# Patient Record
Sex: Male | Born: 1940 | Race: Black or African American | Hispanic: No | Marital: Single | Smoking: Never smoker
Health system: Southern US, Community
[De-identification: ages and names within clinical notes are randomized; demographics above are authoritative.]

## PROBLEM LIST (undated history)

## (undated) DIAGNOSIS — H547 Unspecified visual loss: Secondary | ICD-10-CM

## (undated) DIAGNOSIS — F79 Unspecified intellectual disabilities: Secondary | ICD-10-CM

---

## 2010-06-10 ENCOUNTER — Inpatient Hospital Stay (HOSPITAL_COMMUNITY)
Admission: EM | Admit: 2010-06-10 | Discharge: 2010-06-14 | DRG: 093 | Disposition: A | Discharge status: Home / Self Care | Payer: Medicare Other | Attending: Internal Medicine | Admitting: Internal Medicine

## 2010-06-10 ENCOUNTER — Emergency Department (HOSPITAL_COMMUNITY): Payer: Medicare Other

## 2010-06-10 DIAGNOSIS — R5381 Other malaise: Secondary | ICD-10-CM | POA: Diagnosis present

## 2010-06-10 DIAGNOSIS — R279 Unspecified lack of coordination: Principal | ICD-10-CM | POA: Diagnosis present

## 2010-06-10 DIAGNOSIS — G40909 Epilepsy, unspecified, not intractable, without status epilepticus: Secondary | ICD-10-CM | POA: Diagnosis present

## 2010-06-10 DIAGNOSIS — D509 Iron deficiency anemia, unspecified: Secondary | ICD-10-CM | POA: Diagnosis present

## 2010-06-10 DIAGNOSIS — I10 Essential (primary) hypertension: Secondary | ICD-10-CM | POA: Diagnosis present

## 2010-06-10 DIAGNOSIS — R4182 Altered mental status, unspecified: Secondary | ICD-10-CM | POA: Diagnosis present

## 2010-06-10 DIAGNOSIS — T420X5A Adverse effect of hydantoin derivatives, initial encounter: Secondary | ICD-10-CM | POA: Diagnosis present

## 2010-06-10 DIAGNOSIS — K047 Periapical abscess without sinus: Secondary | ICD-10-CM | POA: Diagnosis present

## 2010-06-10 DIAGNOSIS — E785 Hyperlipidemia, unspecified: Secondary | ICD-10-CM | POA: Diagnosis present

## 2010-06-10 DIAGNOSIS — D696 Thrombocytopenia, unspecified: Secondary | ICD-10-CM | POA: Diagnosis present

## 2010-06-10 DIAGNOSIS — Z8673 Personal history of transient ischemic attack (TIA), and cerebral infarction without residual deficits: Secondary | ICD-10-CM

## 2010-06-10 DIAGNOSIS — Z9181 History of falling: Secondary | ICD-10-CM

## 2010-06-10 LAB — BASIC METABOLIC PANEL
CO2: 29 mEq/L (ref 19–32)
Chloride: 100 mEq/L (ref 96–112)
GFR calc non Af Amer: >60 mL/min (ref >60)
Glucose, Bld: 96 mg/dL (ref 70–99)
Potassium: 3.9 mEq/L (ref 3.5–5.1)
Sodium: 140 mEq/L (ref 135–145)

## 2010-06-10 LAB — CBC
HCT: 42.7 % (ref 39.0–52.0)
Hemoglobin: 13.9 g/dL (ref 13.0–17.0)
MCV: 98.8 fL (ref 78.0–100.0)
RBC: 4.32 MIL/uL (ref 4.22–5.81)
RDW: 13.5 % (ref 11.5–15.5)
WBC: 4.5 10*3/uL (ref 4.0–10.5)

## 2010-06-10 LAB — DIFFERENTIAL
Basophils Absolute: 0 10*3/uL (ref 0.0–0.1)
Lymphocytes Relative: 44 % (ref 12–46)
Lymphs Abs: 2 10*3/uL (ref 0.7–4.0)
Neutro Abs: 2.1 10*3/uL (ref 1.7–7.7)
Neutrophils Relative %: 47 % (ref 43–77)

## 2010-06-11 LAB — URINE MICROSCOPIC-ADD ON

## 2010-06-11 LAB — URINALYSIS, ROUTINE W REFLEX MICROSCOPIC
Glucose, UA: NEGATIVE mg/dL
Leukocytes, UA: NEGATIVE
Protein, ur: NEGATIVE mg/dL
Specific Gravity, Urine: 1.02 (ref 1.005–1.030)
Urobilinogen, UA: 1 mg/dL (ref 0.0–1.0)

## 2010-06-11 LAB — CBC
HCT: 37.6 % — ABNORMAL LOW (ref 39.0–52.0)
MCV: 98.9 fL (ref 78.0–100.0)
RBC: 3.8 MIL/uL — ABNORMAL LOW (ref 4.22–5.81)
WBC: 4.1 10*3/uL (ref 4.0–10.5)

## 2010-06-11 LAB — CARDIAC PANEL(CRET KIN+CKTOT+MB+TROPI)
Relative Index: INVALID (ref 0.0–2.5)
Relative Index: 1.5 (ref 0.0–2.5)
Troponin I: 0.03 ng/mL (ref 0.00–0.06)
Troponin I: 0.03 ng/mL (ref 0.00–0.06)

## 2010-06-11 LAB — COMPREHENSIVE METABOLIC PANEL
Alkaline Phosphatase: 89 U/L (ref 39–117)
BUN: 10 mg/dL (ref 6–23)
Chloride: 104 mEq/L (ref 96–112)
GFR calc non Af Amer: >60 mL/min (ref >60)
Glucose, Bld: 69 mg/dL — ABNORMAL LOW (ref 70–99)
Potassium: 3.8 mEq/L (ref 3.5–5.1)
Total Bilirubin: 0.8 mg/dL (ref 0.3–1.2)

## 2010-06-12 LAB — CBC
HCT: 36.3 % — ABNORMAL LOW (ref 39.0–52.0)
Hemoglobin: 11.9 g/dL — ABNORMAL LOW (ref 13.0–17.0)
RDW: 13.4 % (ref 11.5–15.5)
WBC: 4.9 10*3/uL (ref 4.0–10.5)

## 2010-06-12 LAB — BASIC METABOLIC PANEL
CO2: 28 mEq/L (ref 19–32)
GFR calc non Af Amer: >60 mL/min (ref >60)
Glucose, Bld: 101 mg/dL — ABNORMAL HIGH (ref 70–99)
Potassium: 3.5 mEq/L (ref 3.5–5.1)
Sodium: 139 mEq/L (ref 135–145)

## 2010-06-12 LAB — LIPID PANEL
HDL: 41 mg/dL (ref >39)
Triglycerides: 79 mg/dL (ref <150)

## 2010-06-13 LAB — CBC
MCH: 31.6 pg (ref 26.0–34.0)
MCV: 97 fL (ref 78.0–100.0)
Platelets: 135 10*3/uL — ABNORMAL LOW (ref 150–400)
RDW: 13.4 % (ref 11.5–15.5)
WBC: 4 10*3/uL (ref 4.0–10.5)

## 2010-06-13 LAB — IRON AND TIBC: UIBC: 85 ug/dL

## 2010-06-13 LAB — PHENYTOIN LEVEL, TOTAL: Phenytoin Lvl: 21.5 ug/mL — ABNORMAL HIGH (ref 10.0–20.0)

## 2010-06-13 LAB — BASIC METABOLIC PANEL
BUN: 8 mg/dL (ref 6–23)
Calcium: 8.3 mg/dL — ABNORMAL LOW (ref 8.4–10.5)
Creatinine, Ser: 0.93 mg/dL (ref 0.4–1.5)
GFR calc non Af Amer: >60 mL/min (ref >60)
Potassium: 3.3 mEq/L — ABNORMAL LOW (ref 3.5–5.1)

## 2010-06-13 LAB — VITAMIN B12: Vitamin B-12: 494 pg/mL (ref 211–911)

## 2010-06-13 LAB — FOLATE: Folate: 3.6 ng/mL

## 2010-06-14 LAB — PHENYTOIN LEVEL, TOTAL: Phenytoin Lvl: 16.7 ug/mL (ref 10.0–20.0)

## 2010-06-15 NOTE — Discharge Summary (Signed)
[name], [name]                  ACCOUNT NO.:  [id number]  MEDICAL RECORD NO.:  [id number]           PATIENT TYPE:  I  LOCATION:  1517                         FACILITY:  [facility]  PHYSICIAN:  Hillery Aldo, M.D.   DATE OF BIRTH:  Nov 21, 1940  DATE OF ADMISSION:  06/10/2010 DATE OF DISCHARGE:  06/14/2010                              DISCHARGE SUMMARY   PRIMARY CARE PHYSICIAN:  Dr. Knox Royalty.  DISCHARGE DIAGNOSES: 1. Ataxia and altered mental status secondary to Dilantin toxicity. 2. 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 3. Periapical abscess. 4. History of seizure disorder. 5. Hypertension. 6. Dyslipidemia. 7. Mild normocytic anemia. 8. Mild thrombocytopenia.  DISCHARGE MEDICATIONS: 1. Amlodipine 10 mg p.o. daily. 2. Augmentin 875 mg p.o. b.i.d. x 3 more days. 3. Aspirin 81 mg p.o. daily. 4. Zocor 20 mg p.o. daily. 5. Dilantin decreased to 100 mg p.o. b.i.d. 6. Tegretol 400 mg p.o. q.h.s. 7. Folic acid 1 mg p.o. daily.  CONSULTATIONS:  None.  BRIEF ADMISSION HPI:  The patient is a 70 year old male with past medical history of seizure disorder, who presented to the hospital with generalized weakness and ataxia.  The patient had some falls at home due to his ataxia.  The patient subsequently was brought into the hospital by family and was found to be Dilantin toxic with a Dilantin level of 35.7.  He subsequently was referred to the hospitalist's service for inpatient evaluation and treatment.  For the full details, please see the dictated report done by Dr. Pearson Grippe.  PROCEDURES AND DIAGNOSTIC STUDIES: 1. CT scan of the head on June 10, 2010 showed no acute intracranial     abnormality.  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 2. Maxillofacial CT without contrast on June 10, 2010 showed no     fractures.  Extensive periapical lucency around the roots of tooth     #19.  This is consistent with periapical  abscess. 3. Chest x-ray on June 10, 2010 was normal.  DISCHARGE LABORATORY VALUES:  Dilantin level was 16.7.  Iron was 81, total iron binding capacity 166 with 49% saturation.  Urine iron-binding capacity was 85.  B12 was 494, serum folate low at 3.6.  Ferritin was 114.  Urine cultures were negative.  Lipids showed a cholesterol of 196, triglycerides 79, HDL 41, and LDL of 139.  Cardiac markers were negative x 3 sets.  Initial Tegretol level was low at less than 2.  HOSPITAL COURSE BY PROBLEM: 1. Ataxia and altered mental status secondary to Dilantin toxicity:     The patient was admitted and his Dilantin level was checked     serially.  His Dilantin was held throughout the course of his     hospital stay and will be resumed at a lower dose on June 15, 2010.  The patient has not had a seizure in an extended period of     time and at this point, would recommend not increasing his Dilantin     even if his levels are subtherapeutic if he  is not having problems     with breakthrough seizures. 2. History of cerebrovascular disease/old CVA:  The patient was put on     low-dose aspirin therapy for secondary prevention.  Attention was     also made towards better blood pressure and lipid control with the     goal blood pressure of less than 130/85 and a target LDL of less     than 100. 3. Periapical abscess:  The patient has been on Augmentin and will     complete his course in 3 more days.  Would recommend dental     followup to see if he has any symptoms referable to toothache or     headaches for further treatment. 4. Seizure disorder:  Again, the patient has not had a seizure for a     prolonged period of time.  His Dilantin level was high and his     Tegretol level was low while in the hospital.  He was put on a     higher dose of Tegretol while in the hospital but will be     discharged on his home regimen with further outpatient followup.     Again, would not target a  therapeutic range if he is not having     breakthrough seizures and would maintain him on his usual     outpatient dose of Tegretol and a lower dose of Dilantin. 5. Hypertension:  The patient did develop fairly elevated blood     pressure readings while in the hospital.  He was subsequently put     on low-dose Norvasc and this was titrated up to achieve good blood     pressure control.  His discharge blood pressure is 125/85.  The     patient's nephew states that he has not ever been diagnosed with     hypertension before and is reasonable to consider discontinuing     this medication with close followup if he proves to have good blood     pressure control in outpatient setting. 6. Dyslipidemia:  Given the patient has evidence of old an old stroke     on CT scanning, recommend a target LDL of less than 100.  He was     placed on Zocor 20 mg p.o. daily and will need a followup fasting     lipid panel and liver function studies done in approximately 6     weeks. 7. Mild anemia/thrombocytopenia:  May be due to folic acid deficiency.     The patient will be discharged home on folic acid supplementation     therapy.  DISPOSITION:  The patient is medically stable and will be discharged home.  CONDITION ON DISCHARGE:  Improved.  Time spent coordinating care for discharge and discharge instruction including face-to-face time equals 35 minutes.  DISCHARGE DIET:  Heart healthy, no added salt.     Hillery Aldo, M.D.    CR/MEDQ  D:  06/14/2010  T:  06/14/2010  Job:  [id number]  cc:   Dr. Knox Royalty  Electronically Signed by Hillery Aldo M.D. on 06/15/2010 04:54:09 PM

## 2010-07-08 NOTE — H&P (Signed)
[name], [name]                  ACCOUNT NO.:  [id number]  MEDICAL RECORD NO.:  [id number]           PATIENT TYPE:  E  LOCATION:  WLED                         FACILITY:  [facility]  PHYSICIAN:  Massie Maroon, MD        DATE OF BIRTH:  02-17-1941  DATE OF ADMISSION:  06/10/2010 DATE OF DISCHARGE:                             HISTORY & PHYSICAL   CHIEF COMPLAINT:  Ataxia.  HISTORY OF PRESENT ILLNESS:  This is a 70 year old male with a history of seizure disorder, apparently has had difficulty with walking.  He has complained of weakness as well.  The patient apparently last week tried to get up and then fell face on the floor.  He was complaining of some headache yesterday and he took some ibuprofen and then felt a little bit better, but then also complained about his head hurting again today, so his nephew brought him to the ED for evaluation.  His balance has been poor in that appears to be why he has been falling.  His nephew notes that his Dilantin was increased back in October and today when his dilantin level was checked, it was found to be 35.7.  His tegretol level was low at less than 2.0.  The patient had a chest x-ray, which was negative.  CT scan of the maxillofacial area showed periapical abscess of tooth #19.  CT brain was negative for any acute abnormality, but showed old caudate lacunar infarctions and atrophy.  The patient will be admitted for ataxia and falls secondary to supratherapeutic dilantin level.  PAST MEDICAL HISTORY: 1. Seizure disorder. 2. CVA as per CT scan on June 10, 2010.  PAST SURGICAL HISTORY: 1. Hernia repair. 2. Cataracts.  SOCIAL HISTORY:  The patient does not smoke or drink.  He lives with his nephew who can be reached at [phone].  FAMILY HISTORY:  Positive for diabetes and hypertension.  ALLERGIES:  No known drug allergies.  MEDICATIONS: 1. Tegretol 600 mg p.o. q.h.s. 2. Dilantin Infatabs 200 mg p.o. q.h.s. and 100 mg p.o.  q.a.m.  REVIEW OF SYSTEMS:  Negative for all 10 organ systems except for pertinent positives stated above.  PHYSICAL EXAMINATION:  VITAL SIGNS:  Temperature 98.6, pulse 88, blood pressure 122/90, pulse oximetry is 97% on room air. HEENT:  Anicteric, EOMI, no nystagmus.  Pupils 1.5 mm, symmetric. Direct consensual near reflexes intact.  Positive arcus senilis.  NECK: No JVD, no bruit. HEART:  Regular rate and rhythm.  S1 and S2.  No murmurs, gallops or rubs. LUNGS:  Clear to auscultation bilaterally. ABDOMEN:  Soft, nontender, nondistended.  Positive bowel sounds. EXTREMITIES:  No cyanosis, clubbing or edema. SKIN:  No rashes. LYMPH NODES:  No adenopathy. NEUROLOGIC:  Nonfocal.  No pronator drift.  Reflexes are 2+, symmetric, diffuse with downgoing toes bilaterally.  Motor strength 5/5 in all 4 extremities.  Pinprick intact.  LABORATORY DATA:  Dilantin 35.7.  Tegretol less than 2.0.  Sodium 140, potassium 3.9, BUN 10, creatinine 1.08.  WBC 4.5, hemoglobin 13.9, platelet count 183.  Chest x-ray, normal chest.  CT maxillofacial shows a periapical  abscess, tooth #19.  CT brain shows old lacunar caudate infarcts as above.  EKG shows normal sinus rhythm at 75, left axis deviation, early R-wave progression, Q-waves in V4, V5, and V6.  No prior EKG for comparison.  ASSESSMENT/PLAN: 1. Ataxia, falls:  This is likely secondary to supratherapeutic     dilantin level.  We will hold Dilantin and recheck a level in the     a.m. 2. Old cerebrovascular accident:  Check a lipid panel.  The patient     will be started on aspirin 81 mg p.o. daily.  We will also start     him on Zocor 40 mg p.o. q.h.s. 3. Periapical abscess:  Augmentin 875 mg p.o. b.i.d. 4. Deep venous thrombosis prophylaxis:  SCDs.     Massie Maroon, MD     JYK/MEDQ  D:  06/10/2010  T:  06/11/2010  Job:  [id number]  cc:   Knox Royalty, MD  Electronically Signed by Pearson Grippe MD on 07/08/2010 01:43:41 AM

## 2018-05-04 ENCOUNTER — Inpatient Hospital Stay (HOSPITAL_COMMUNITY)
Admission: EM | Admit: 2018-05-04 | Discharge: 2018-05-09 | DRG: 871 | Disposition: A | Discharge status: Home Health | Payer: Medicare (Managed Care) | Attending: Internal Medicine | Admitting: Internal Medicine

## 2018-05-04 ENCOUNTER — Emergency Department (HOSPITAL_COMMUNITY): Payer: Medicare (Managed Care)

## 2018-05-04 ENCOUNTER — Other Ambulatory Visit: Payer: Self-pay

## 2018-05-04 ENCOUNTER — Encounter (HOSPITAL_COMMUNITY): Payer: Self-pay

## 2018-05-04 DIAGNOSIS — D696 Thrombocytopenia, unspecified: Secondary | ICD-10-CM | POA: Diagnosis not present

## 2018-05-04 DIAGNOSIS — N170 Acute kidney failure with tubular necrosis: Secondary | ICD-10-CM | POA: Diagnosis present

## 2018-05-04 DIAGNOSIS — N10 Acute tubulo-interstitial nephritis: Secondary | ICD-10-CM | POA: Diagnosis not present

## 2018-05-04 DIAGNOSIS — E876 Hypokalemia: Secondary | ICD-10-CM | POA: Diagnosis present

## 2018-05-04 DIAGNOSIS — G40909 Epilepsy, unspecified, not intractable, without status epilepticus: Secondary | ICD-10-CM | POA: Diagnosis present

## 2018-05-04 DIAGNOSIS — J189 Pneumonia, unspecified organism: Secondary | ICD-10-CM

## 2018-05-04 DIAGNOSIS — R7881 Bacteremia: Secondary | ICD-10-CM | POA: Diagnosis not present

## 2018-05-04 DIAGNOSIS — N368 Other specified disorders of urethra: Secondary | ICD-10-CM | POA: Diagnosis not present

## 2018-05-04 DIAGNOSIS — E872 Acidosis: Secondary | ICD-10-CM | POA: Diagnosis present

## 2018-05-04 DIAGNOSIS — R6250 Unspecified lack of expected normal physiological development in childhood: Secondary | ICD-10-CM | POA: Diagnosis not present

## 2018-05-04 DIAGNOSIS — R509 Fever, unspecified: Secondary | ICD-10-CM | POA: Diagnosis not present

## 2018-05-04 DIAGNOSIS — A419 Sepsis, unspecified organism: Secondary | ICD-10-CM

## 2018-05-04 DIAGNOSIS — A4151 Sepsis due to Escherichia coli [E. coli]: Secondary | ICD-10-CM | POA: Diagnosis present

## 2018-05-04 DIAGNOSIS — R0902 Hypoxemia: Secondary | ICD-10-CM | POA: Diagnosis present

## 2018-05-04 DIAGNOSIS — A4150 Gram-negative sepsis, unspecified: Secondary | ICD-10-CM | POA: Diagnosis present

## 2018-05-04 DIAGNOSIS — J690 Pneumonitis due to inhalation of food and vomit: Secondary | ICD-10-CM | POA: Diagnosis present

## 2018-05-04 DIAGNOSIS — N136 Pyonephrosis: Secondary | ICD-10-CM | POA: Diagnosis present

## 2018-05-04 DIAGNOSIS — N401 Enlarged prostate with lower urinary tract symptoms: Secondary | ICD-10-CM | POA: Diagnosis present

## 2018-05-04 DIAGNOSIS — F79 Unspecified intellectual disabilities: Secondary | ICD-10-CM | POA: Diagnosis present

## 2018-05-04 DIAGNOSIS — N320 Bladder-neck obstruction: Secondary | ICD-10-CM | POA: Diagnosis present

## 2018-05-04 DIAGNOSIS — A408 Other streptococcal sepsis: Secondary | ICD-10-CM | POA: Diagnosis present

## 2018-05-04 DIAGNOSIS — R339 Retention of urine, unspecified: Secondary | ICD-10-CM | POA: Diagnosis present

## 2018-05-04 DIAGNOSIS — D72819 Decreased white blood cell count, unspecified: Secondary | ICD-10-CM | POA: Diagnosis present

## 2018-05-04 DIAGNOSIS — N3001 Acute cystitis with hematuria: Secondary | ICD-10-CM

## 2018-05-04 DIAGNOSIS — N390 Urinary tract infection, site not specified: Secondary | ICD-10-CM | POA: Diagnosis not present

## 2018-05-04 DIAGNOSIS — Z66 Do not resuscitate: Secondary | ICD-10-CM | POA: Diagnosis present

## 2018-05-04 DIAGNOSIS — R0602 Shortness of breath: Secondary | ICD-10-CM

## 2018-05-04 DIAGNOSIS — Z79899 Other long term (current) drug therapy: Secondary | ICD-10-CM | POA: Diagnosis not present

## 2018-05-04 DIAGNOSIS — N1330 Unspecified hydronephrosis: Secondary | ICD-10-CM | POA: Diagnosis present

## 2018-05-04 DIAGNOSIS — D72829 Elevated white blood cell count, unspecified: Secondary | ICD-10-CM

## 2018-05-04 HISTORY — DX: Unspecified intellectual disabilities: F79

## 2018-05-04 HISTORY — DX: Unspecified visual loss: H54.7

## 2018-05-04 LAB — URINALYSIS, ROUTINE W REFLEX MICROSCOPIC
Bilirubin Urine: NEGATIVE
GLUCOSE, UA: NEGATIVE mg/dL
KETONES UR: NEGATIVE mg/dL
NITRITE: NEGATIVE
PH: 5 (ref 5.0–8.0)
Protein, ur: NEGATIVE mg/dL
RBC / HPF: >50 RBC/hpf — ABNORMAL HIGH (ref 0–5)
Specific Gravity, Urine: 1.006 (ref 1.005–1.030)

## 2018-05-04 LAB — COMPREHENSIVE METABOLIC PANEL
ALT: 16 U/L (ref 0–44)
ANION GAP: 19 — AB (ref 5–15)
AST: 36 U/L (ref 15–41)
Albumin: 3.5 g/dL (ref 3.5–5.0)
Alkaline Phosphatase: 88 U/L (ref 38–126)
BUN: 18 mg/dL (ref 8–23)
CO2: 20 mmol/L — AB (ref 22–32)
Calcium: 9.4 mg/dL (ref 8.9–10.3)
Chloride: 105 mmol/L (ref 98–111)
Creatinine, Ser: 1.97 mg/dL — ABNORMAL HIGH (ref 0.61–1.24)
GFR calc Af Amer: 37 mL/min — ABNORMAL LOW (ref >60)
GFR calc non Af Amer: 32 mL/min — ABNORMAL LOW (ref >60)
GLUCOSE: 95 mg/dL (ref 70–99)
POTASSIUM: 3.4 mmol/L — AB (ref 3.5–5.1)
SODIUM: 144 mmol/L (ref 135–145)
Total Bilirubin: 1.9 mg/dL — ABNORMAL HIGH (ref 0.3–1.2)
Total Protein: 6.7 g/dL (ref 6.5–8.1)

## 2018-05-04 LAB — CBC WITH DIFFERENTIAL/PLATELET
ABS IMMATURE GRANULOCYTES: 0.01 10*3/uL (ref 0.00–0.07)
Basophils Absolute: 0 10*3/uL (ref 0.0–0.1)
Basophils Relative: 1 %
EOS ABS: 0 10*3/uL (ref 0.0–0.5)
Eosinophils Relative: 0 %
HCT: 44.6 % (ref 39.0–52.0)
HEMOGLOBIN: 14.3 g/dL (ref 13.0–17.0)
Immature Granulocytes: 1 %
LYMPHS ABS: 0.3 10*3/uL — AB (ref 0.7–4.0)
LYMPHS PCT: 18 %
MCH: 32.1 pg (ref 26.0–34.0)
MCHC: 32.1 g/dL (ref 30.0–36.0)
MCV: 100 fL (ref 80.0–100.0)
Monocytes Absolute: 0 10*3/uL — ABNORMAL LOW (ref 0.1–1.0)
Monocytes Relative: 1 %
NEUTROS PCT: 79 %
Neutro Abs: 1.4 10*3/uL — ABNORMAL LOW (ref 1.7–7.7)
PLATELETS: 95 10*3/uL — AB (ref 150–400)
RBC: 4.46 MIL/uL (ref 4.22–5.81)
RDW: 12.9 % (ref 11.5–15.5)
WBC: 1.7 10*3/uL — ABNORMAL LOW (ref 4.0–10.5)
nRBC: 0 % (ref 0.0–0.2)

## 2018-05-04 LAB — HEMOGLOBIN A1C
Hgb A1c MFr Bld: 5.8 % — ABNORMAL HIGH (ref 4.8–5.6)
Mean Plasma Glucose: 119.76 mg/dL

## 2018-05-04 LAB — LACTIC ACID, PLASMA
Lactic Acid, Venous: 7.7 mmol/L (ref 0.5–1.9)
Lactic Acid, Venous: 6.3 mmol/L (ref 0.5–1.9)
Lactic Acid, Venous: 4.2 mmol/L (ref 0.5–1.9)

## 2018-05-04 LAB — TSH: TSH: 0.713 u[IU]/mL (ref 0.350–4.500)

## 2018-05-04 LAB — LIPASE, BLOOD: Lipase: 23 U/L (ref 11–51)

## 2018-05-04 LAB — INFLUENZA PANEL BY PCR (TYPE A & B)
Influenza A By PCR: NEGATIVE
Influenza B By PCR: NEGATIVE

## 2018-05-04 MED ORDER — SODIUM CHLORIDE 0.9 % IV SOLN: 1.0000 g | INTRAVENOUS | Status: DC

## 2018-05-04 MED ORDER — IBUPROFEN 600 MG PO TABS: 600.0000 mg | ORAL_TABLET | Freq: Four times a day (QID) | ORAL | Status: DC | PRN

## 2018-05-04 MED ORDER — ACETAMINOPHEN 325 MG PO TABS: 650.0000 mg | ORAL_TABLET | Freq: Four times a day (QID) | ORAL | Status: DC | PRN

## 2018-05-04 MED ORDER — VANCOMYCIN HCL 500 MG IV SOLR: 500.0000 mg | INTRAVENOUS | Status: DC

## 2018-05-04 MED ORDER — SODIUM CHLORIDE 0.9 % IV BOLUS
500.0000 mL | Freq: Once | INTRAVENOUS | Status: AC
  Administered 2018-05-04: 500 mL via INTRAVENOUS

## 2018-05-04 MED ORDER — LACTATED RINGERS IV SOLN
INTRAVENOUS | Status: DC
  Administered 2018-05-04: 13:00:00 via INTRAVENOUS

## 2018-05-04 MED ORDER — ACETAMINOPHEN 650 MG RE SUPP: 650.0000 mg | Freq: Four times a day (QID) | RECTAL | Status: DC | PRN

## 2018-05-04 MED ORDER — LATANOPROST 0.005 % OP SOLN
1.0000 [drp] | Freq: Every day | OPHTHALMIC | Status: DC
  Administered 2018-05-04 – 2018-05-08 (×4): 1 [drp] via OPHTHALMIC
  Filled 2018-05-04: qty 2.5

## 2018-05-04 MED ORDER — VANCOMYCIN HCL IN DEXTROSE 1-5 GM/200ML-% IV SOLN
1000.0000 mg | Freq: Once | INTRAVENOUS | Status: AC
  Administered 2018-05-04: 1000 mg via INTRAVENOUS
  Filled 2018-05-04: qty 200

## 2018-05-04 MED ORDER — POLYETHYLENE GLYCOL 3350 17 G PO PACK: 17.0000 g | PACK | Freq: Every day | ORAL | Status: DC | PRN

## 2018-05-04 MED ORDER — METRONIDAZOLE IN NACL 5-0.79 MG/ML-% IV SOLN
500.0000 mg | Freq: Three times a day (TID) | INTRAVENOUS | Status: DC
  Administered 2018-05-04: 500 mg via INTRAVENOUS
  Filled 2018-05-04: qty 100

## 2018-05-04 MED ORDER — SODIUM CHLORIDE 0.9 % IV BOLUS (SEPSIS)
1000.0000 mL | Freq: Once | INTRAVENOUS | Status: AC
  Administered 2018-05-04: 1000 mL via INTRAVENOUS

## 2018-05-04 MED ORDER — ENSURE ENLIVE PO LIQD
237.0000 mL | Freq: Three times a day (TID) | ORAL | Status: DC
  Administered 2018-05-04 – 2018-05-09 (×7): 237 mL via ORAL
  Filled 2018-05-04: qty 237

## 2018-05-04 MED ORDER — POTASSIUM CHLORIDE IN NACL 20-0.9 MEQ/L-% IV SOLN
INTRAVENOUS | Status: DC
  Administered 2018-05-04 – 2018-05-05 (×2): via INTRAVENOUS
  Filled 2018-05-04 (×3): qty 1000

## 2018-05-04 MED ORDER — SODIUM CHLORIDE 0.9 % IV SOLN
2.0000 g | Freq: Once | INTRAVENOUS | Status: AC
  Administered 2018-05-04: 2 g via INTRAVENOUS
  Filled 2018-05-04: qty 2

## 2018-05-04 MED ORDER — KETOROLAC TROMETHAMINE 15 MG/ML IJ SOLN: 15.0000 mg | Freq: Four times a day (QID) | INTRAMUSCULAR | Status: DC | PRN

## 2018-05-04 MED ORDER — DORZOLAMIDE HCL 2 % OP SOLN
1.0000 [drp] | Freq: Every day | OPHTHALMIC | Status: DC
  Administered 2018-05-04 – 2018-05-09 (×6): 1 [drp] via OPHTHALMIC
  Filled 2018-05-04: qty 10

## 2018-05-04 MED ORDER — IOHEXOL 300 MG/ML  SOLN
75.0000 mL | Freq: Once | INTRAMUSCULAR | Status: AC | PRN
  Administered 2018-05-04: 75 mL via INTRAVENOUS

## 2018-05-04 MED ORDER — ACETAMINOPHEN 650 MG RE SUPP
650.0000 mg | Freq: Once | RECTAL | Status: AC
  Administered 2018-05-04: 650 mg via RECTAL
  Filled 2018-05-04: qty 1

## 2018-05-04 MED ORDER — HEPARIN SODIUM (PORCINE) 5000 UNIT/ML IJ SOLN
5000.0000 [IU] | Freq: Three times a day (TID) | INTRAMUSCULAR | Status: DC
  Administered 2018-05-04 – 2018-05-08 (×13): 5000 [IU] via SUBCUTANEOUS
  Filled 2018-05-04 (×13): qty 1

## 2018-05-04 MED ORDER — LEVETIRACETAM 250 MG PO TABS
250.0000 mg | ORAL_TABLET | Freq: Two times a day (BID) | ORAL | Status: DC
  Administered 2018-05-04: 250 mg via ORAL
  Filled 2018-05-04 (×2): qty 1

## 2018-05-04 MED ORDER — SODIUM CHLORIDE 0.9 % IV BOLUS (SEPSIS)
250.0000 mL | Freq: Once | INTRAVENOUS | Status: AC
  Administered 2018-05-04: 250 mL via INTRAVENOUS

## 2018-05-04 NOTE — ED Notes (Signed)
Attempted to call report x 1  

## 2018-05-04 NOTE — H&P (Signed)
History and Physical  Unassigned medical admission    Hai Tasker OBS:[id number] DOB: [birth year] DOA: 05/04/2018  PCP: Kristie Cowman, MD  Patient coming from: Home  I have personally briefly reviewed patient's old medical records in [place]  Chief Complaint: Not behaving his usual self possible seizure at home  HPI: [name] is a 78 y.o. 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  He was up this morning and was given breakfast.  He then went back to his room and was checked on by his family member and he was noted to be moving what look like a seizure-like activity.  Patient does have a seizure disorder and takes Keppra but he has not had a seizure since he moved to [place] 3 years ago.  He felt hot to the touch and concern was raised for a fever.  EMS was activated and transported the patient into the emergency department.  Patient would not allow for blood pressure check because he was flexing his arms and resisting.  Patient had 2 episodes of vomiting in the emergency department.  He is now speaking a little bit saying "get me covered up" and "God help me".    ED Course: In the emergency department he was found to be extremely ill with creatinine of 1.97, potassium 3.4, a lactic acid of 6.3, a white blood cell count of 1.7, a platelet count of 95, CT scan of the head showed atrophy able infarcts.  CT scan of the abdomen and pelvis showed lateral hydronephrosis with distention of the bladder and dilatation of the penile urethra.  There is suspected a distal penile mass or a distal urethral stenosis.  An irregularly and enlarged median lobe of the prostate gland nonspecific hepatic periportal edema. Urinalysis was abnormal patient referred to internal medicine for further evaluation and management.  Review of Systems: As per HPI otherwise all other  systems reviewed and  negative.    Past Medical History:  Diagnosis Date  . Mentally disabled     History reviewed. No pertinent surgical history.  Social History   Social History Narrative   Mental retardation with developmental delay followed at [facility]     reports that he has never smoked. He has never used smokeless tobacco. No history on file for alcohol and drug.  No Known Allergies  Family History  Problem Relation Age of Onset  . Prostate cancer Brother      Prior to Admission medications   Medication Sig Start Date End Date Taking? Authorizing Provider  bimatoprost (LUMIGAN) 0.01 % SOLN Place 1 drop into both eyes at bedtime.   Yes [provider]  dorzolamide (TRUSOPT) 2 % ophthalmic solution Place 1 drop into both eyes daily.   Yes [provider]  ENSURE PLUS (ENSURE PLUS) LIQD Take 237 mLs by mouth 3 (three) times daily between meals.   Yes [provider]  levETIRAcetam (KEPPRA) 250 MG tablet Take 250 mg by mouth 2 (two) times daily.   Yes [provider]    Physical Exam:  Constitutional: NAD, chronically ill confused Vitals:   05/04/18 1330 05/04/18 1400 05/04/18 1415 05/04/18 1445  BP: (!) 146/106 (!) 122/102 (!) 117/94 (!) 116/95  Pulse:  (!) 120 (!) 117 (!) 116  Resp: 18 (!) '23 19 14  ' Temp:      TempSrc:  SpO2:  98% 97% 98%  Weight:      Height:       Eyes: PERRL, lids and conjunctivae normal ENMT: Mucous membranes are moist. Posterior pharynx clear of any exudate or lesions.poor dentition.  Neck: normal, supple, no masses, no thyromegaly Respiratory: clear to auscultation bilaterally, no wheezing, no crackles. Normal respiratory effort. No accessory muscle use.  Cardiovascular: Regular rate and rhythm, no murmurs / rubs / gallops. No extremity edema. 2+ pedal pulses. No carotid bruits.  Abdomen: no tenderness, no masses palpated. No hepatosplenomegaly. Bowel sounds positive.  Musculoskeletal:  no clubbing / cyanosis. No joint deformity upper and lower extremities. Good ROM, no contractures. Normal muscle tone.  Skin: no rashes, lesions, ulcers. No induration Neurologic: Sensation intact moves all extremities does not follow commands.      Labs on Admission: I have personally reviewed following labs and imaging studies  CBC: Recent Labs  Lab 05/04/18 0815  WBC 1.7*  NEUTROABS 1.4*  HGB 14.3  HCT 44.6  MCV 100.0  PLT 95*   Basic Metabolic Panel: Recent Labs  Lab 05/04/18 0815  NA 144  K 3.4*  CL 105  CO2 20*  GLUCOSE 95  BUN 18  CREATININE 1.97*  CALCIUM 9.4   GFR: Estimated Creatinine Clearance: 19.8 mL/min (A) (by C-G formula based on SCr of 1.97 mg/dL (H)). Liver Function Tests: Recent Labs  Lab 05/04/18 0815  AST 36  ALT 16  ALKPHOS 88  BILITOT 1.9*  PROT 6.7  ALBUMIN 3.5   Recent Labs  Lab 05/04/18 0815  LIPASE 23   Urine analysis:    Component Value Date/Time   COLORURINE YELLOW 05/04/2018 1249   APPEARANCEUR HAZY (A) 05/04/2018 1249   LABSPEC 1.006 05/04/2018 1249   PHURINE 5.0 05/04/2018 1249   GLUCOSEU NEGATIVE 05/04/2018 1249   HGBUR LARGE (A) 05/04/2018 1249   BILIRUBINUR NEGATIVE 05/04/2018 1249   KETONESUR NEGATIVE 05/04/2018 1249   PROTEINUR NEGATIVE 05/04/2018 1249   UROBILINOGEN 1.0 06/11/2010 0005   NITRITE NEGATIVE 05/04/2018 1249   LEUKOCYTESUR MODERATE (A) 05/04/2018 1249    Radiological Exams on Admission: Ct Head Wo Contrast  Result Date: 05/04/2018 CLINICAL DATA:  Seizure with nausea and vomiting. Chronic altered mental status EXAM: CT HEAD WITHOUT CONTRAST TECHNIQUE: Contiguous axial images were obtained from the base of the skull through the vertex without intravenous contrast. COMPARISON:  June 10, 2010 FINDINGS: Brain: There is stable mild diffuse atrophy. There is relative enlargement of the atrium of the right lateral ventricle compared to its counterpart on the left, felt to be secondary to ex vacuo  phenomenon. There is no appreciable intracranial mass, hemorrhage, extra-axial fluid collection, or midline shift. 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 There is evidence of a prior infarct in the head of the caudate nucleus on the right. There is evidence of a prior infarct involving a portion of the body of the caudate nucleus on the left. There is evidence of a prior infarct in the left frontal lobe adjacent to the anterior horn of the left lateral ventricle. No acute appearing infarct is appreciable. Vascular: There is no appreciable hyperdense vessel. There is calcification in each carotid siphon region. Skull: The bony calvarium appears intact. Sinuses/Orbits: There is mucosal thickening in several ethmoid air cells. Other visualized paranasal sinuses are clear. Orbits appear symmetric bilaterally except for evident cataract removal on the right. Other: Mastoid air cells are clear. IMPRESSION: Atrophy with stable prior infarcts. No acute  infarct evident. No mass or hemorrhage. Foci of arterial vascular calcification noted. There is mucosal thickening in several ethmoid air cells. Electronically Signed   By: Lowella Grip III M.D.   On: 05/04/2018 12:28   Ct Abdomen Pelvis W Contrast  Result Date: 05/04/2018 CLINICAL DATA:  Nausea and vomiting. EXAM: CT ABDOMEN AND PELVIS WITH CONTRAST TECHNIQUE: Multidetector CT imaging of the abdomen and pelvis was performed using the standard protocol following bolus administration of intravenous contrast. CONTRAST:  74m OMNIPAQUE IOHEXOL 300 MG/ML  SOLN COMPARISON:  None. FINDINGS: Lower chest: No acute abnormality. Hepatobiliary: There are 2 small cysts in the dome of the right lobe of the liver. Slight periportal edema, nonspecific. No focal liver lesions. Biliary tree is normal. Pancreas: Normal. Spleen: Normal. Adrenals/Urinary Tract: There is bilateral moderate hydronephrosis. The ureters are dilated to the level of  the distended bladder. Bladder wall is slightly thickened. There is slight irregular and nodularity of the median lobe of the prostate gland. The patient has dilatation of the penile urethra. No visible catheter in place. Possibility of a urethral stenosis or a distal penile mass should be considered. Stomach/Bowel: No dilated loops of large or small bowel. Detail is suboptimal due to lack of contrast and lack of body fat. The appendix is visible and appears normal. Vascular/Lymphatic: Slight aortic atherosclerosis. No discrete adenopathy. Reproductive: Enlargement and irregularity and nodularity of the median lobe of the prostate gland. Seminal vesicles are normal. Other: No free air or free fluid in the abdomen. No discrete hernias. Musculoskeletal: No acute bone abnormality. IMPRESSION: 1. Bilateral hydronephrosis with distention of the bladder and dilatation of the penile urethra. I suspect there is either a distal penile mass or distal urethral stenosis. 2.  Aortic Atherosclerosis (ICD10-I70.0). 3. Irregular nodularity and enlargement of the median lobe of the prostate gland. 4. Nonspecific hepatic periportal edema. Electronically Signed   By: JLorriane ShireM.D.   On: 05/04/2018 12:54   Dg Chest Port 1 View  Result Date: 05/04/2018 CLINICAL DATA:  Fever and vomiting EXAM: PORTABLE CHEST 1 VIEW COMPARISON:  06/10/2010 FINDINGS: Cardiac shadows within normal limits. Mild aortic calcifications are seen. The lungs are well aerated bilaterally. No focal infiltrate or sizable effusion is seen. No acute bony abnormality is noted. IMPRESSION: No active disease. Electronically Signed   By: MInez CatalinaM.D.   On: 05/04/2018 08:23    EKG: Independently reviewed.  Sinus tachycardia with multifocal PVCs nonspecific interventricular conduction delay, nonspecific ST-T wave abnormalities in inferior leads.  No priors available for comparison.  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  Sepsis likely due to gram-negative UTI:   Start the patient on Rocephin he already received sepsis antibiotics in the emergency department.  He got Vanco, metronidazole, and cefepime.  That was prior to obtaining the urinalysis which was abnormal.  Suspect that placing Foley catheter will decompress kidneys and help with hydronephrosis.  Cultures and urine culture are pending.  2.  Acute kidney injury with acute tubular necrosis: Should improve with decompression of bladder.  Patient's having a significant diuresis.  Will recheck in a.m. labs and avoid nephrotoxic agents.  3.  Urethral obstruction: Unsure if this is related  to prostate or penile mass or a stricture.  Would recommend urology evaluation once more stable.  4.  Bilateral hydronephrosis: Should be improving once decompression complete.  If creatinine does not improve would consider reimaging of the kidneys.  5.  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  He may have underlying prostate disease.  Further work-up once stable.  Would continue Foley catheter for now until evaluation by urology.  6.  Leukopenia: Concerning for sepsis versus primary problem of cell line.  Follow white blood cell count.  If does not improve consider hematology consultation with possible bone marrow biopsy.  7.  Thrombocytopenia: This was noted in some records we have from 2012.  Not sure what his status has been since then.  Will monitor closely.  8.  Hypokalemia: Received some potassium via IV fluids in the emergency department recheck in  a.m.   DVT prophylaxis: Subcu heparin Code Status: DO NOT RESUSCITATE.  Goals of care discussion initiated with family if patient does not improve they may want to consider hospice care.  Not interested in aggressive treatments. Family Communication: Spoke with patient's brother Arnell Sieving and his sister-in-law who are present at the time of admission.  They are patient's next of kin.   Disposition Plan: Likely home but to be determined.  Recommend evaluation with social work. Consults called: None for now Admission status: Inpatient   Lady Deutscher MD FACP [facility] Pager [phone]  How to contact the [facility] Attending or Consulting provider [place] or covering provider during after hours [place], for this patient?  1. Check the care team in [facility] and look for a) attending/consulting [facility] provider listed and b) the [facility] team listed 2. Log into [email or web address] and use [place]'s universal password to access. If you do not have the password, please contact the hospital operator. 3. Locate the [facility] provider you are looking for under [facility] and page to a number that you can be directly reached. 4. If you still have difficulty reaching the provider, please page the [facility] (Director on Call) for the Hospitalists listed on amion for assistance.  If 7PM-7AM, please contact night-coverage [email or web address] Password [facility]  05/04/2018, 3:43 PM

## 2018-05-04 NOTE — ED Triage Notes (Signed)
Pt arrived via [facility]; pt from [facility]; family states pt woke up and had bkfst, went bk to bed and then had "the shakes." Pt has hx of seizures and unknown if he had seizure this am; pt warm to touch and vomited once enroute; pt vomitted twice upon arrival to hospital. 98 P, CBG 119, 24R

## 2018-05-04 NOTE — ED Provider Notes (Signed)
[facility] EMERGENCY DEPARTMENT Provider Note   CSN: [id number] Arrival date & time: 05/04/18  6712     History   Chief Complaint Chief Complaint  Patient presents with  . Seizures    HPI [name] is a 78 y.o. male.  HPI History thus far from EMS.  Patient has MRDD.  He goes to [facility].  He was up this morning and given breakfast.  Reportedly he was then back in his room and checked on by caretaker or family.  Noted to be moving and what looked like seizure-like activity.  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  Reportedly however does not have frequent seizures.  Felt hot to the touch and fever was suspected.  EMS reports in transport he had good radial pulses but would not allow for blood pressure check due to flexing his arms and resisting.  Just on arrival to the emergency department, patient vomited a couple of times a small amounts of pink-tinged emesis with a few food chunks in it.  Patient is saying a few statements but not answering questions.  He is saying things like "get me covered up.  God help me.". Past Medical History:  Diagnosis Date  . Mentally disabled     There are no active problems to display for this patient.    The histories are not reviewed yet. Please review them in the "History" navigator section and refresh this SmartLink.      Home Medications    Prior to Admission medications   Medication Sig Start Date End Date Taking? Authorizing Provider  bimatoprost (LUMIGAN) 0.01 % SOLN Place 1 drop into both eyes at bedtime.   Yes [provider]  dorzolamide (TRUSOPT) 2 % ophthalmic solution Place 1 drop into both eyes daily.   Yes [provider]  ENSURE PLUS (ENSURE PLUS) LIQD Take 237 mLs by mouth 3 (three) times daily between meals.   Yes [provider]  levETIRAcetam (KEPPRA) 250 MG tablet Take 250 mg by mouth 2 (two) times daily.   Yes [provider]     Family History History reviewed. No pertinent family history.  Social History Social History   Tobacco Use  . Smoking status: Not on file  Substance Use Topics  . Alcohol use: Not on file  . Drug use: Not on file     Allergies   Patient has no known allergies.   Review of Systems Review of Systems Level 5 caveat cannot obtain review of systems due to patient condition and cognitive impairment at this time.  Physical Exam Updated Vital Signs BP (!) 150/108   Pulse (!) 124   Temp (!) 101.8 F (38.8 C) (Rectal)   Resp (!) 24   Ht 5\' 7"  (1.702 m)   Wt 45.4 kg   SpO2 98%   BMI 15.66 kg/m   Physical Exam Constitutional:      Comments: Patient is awake and alert.  He does not have respiratory distress.  He is mildly to moderately agitated.  He is physically deconditioned with thin extremities and muscular atrophy.    HENT:     Head: Normocephalic and atraumatic.     Nose: Nose normal.     Mouth/Throat:     Comments: Patient is edentulous.  Mucous membranes are pink and moist. Eyes:     Comments: Patient is blind.  Eyes are kept closed.  He will open eyes slightly to verbal command.  Slight amount  of oily discharge around the lids.  No lid swelling.  Neck:     Musculoskeletal: Neck supple.  Cardiovascular:     Rate and Rhythm: Normal rate and regular rhythm.  Pulmonary:     Effort: Pulmonary effort is normal.     Breath sounds: Normal breath sounds.  Abdominal:     General: There is no distension.     Palpations: Abdomen is soft.     Tenderness: There is no abdominal tenderness. There is no guarding.  Musculoskeletal:     Comments: Patient seems to have a little bit of contracture at the knees.  His extremities have significant muscular atrophy diffusely.  No significant peripheral edema.  No lower extremity cellulitis or wounds.  Skin:    General: Skin is warm and dry.  Neurological:     Comments: Patient is awake.  He does seem alert.  Unclear at this  point what his baseline is.  He is making a few comments.  He is spontaneously moving all 4 extremities.      ED Treatments / Results  Labs (all labs ordered are listed, but only abnormal results are displayed) Labs Reviewed  COMPREHENSIVE METABOLIC PANEL - Abnormal; Notable for the following components:      Result Value   Potassium 3.4 (*)    CO2 20 (*)    Creatinine, Ser 1.97 (*)    Total Bilirubin 1.9 (*)    GFR calc non Af Amer 32 (*)    GFR calc Af Amer 37 (*)    Anion gap 19 (*)    All other components within normal limits  CBC WITH DIFFERENTIAL/PLATELET - Abnormal; Notable for the following components:   WBC 1.7 (*)    Platelets 95 (*)    Neutro Abs 1.4 (*)    Lymphs Abs 0.3 (*)    Monocytes Absolute 0.0 (*)    All other components within normal limits  URINALYSIS, ROUTINE W REFLEX MICROSCOPIC - Abnormal; Notable for the following components:   APPearance HAZY (*)    Hgb urine dipstick LARGE (*)    Leukocytes, UA MODERATE (*)    RBC / HPF >50 (*)    Bacteria, UA MANY (*)    All other components within normal limits  LACTIC ACID, PLASMA - Abnormal; Notable for the following components:   Lactic Acid, Venous 7.7 (*)    All other components within normal limits  LACTIC ACID, PLASMA - Abnormal; Notable for the following components:   Lactic Acid, Venous 6.3 (*)    All other components within normal limits  CULTURE, BLOOD (ROUTINE X 2)  CULTURE, BLOOD (ROUTINE X 2)  URINE CULTURE  LIPASE, BLOOD  INFLUENZA PANEL BY PCR (TYPE A & B)  LACTIC ACID, PLASMA    EKG EKG Interpretation  Date/Time:  Friday May 04 2018 08:07:35 EST Ventricular Rate:  120 PR Interval:    QRS Duration: 121 QT Interval:  333 QTC Calculation: 469 R Axis:   61 Text Interpretation:  Sinus tachycardia Multiform ventricular premature complexes Nonspecific intraventricular conduction delay Nonspecific T abnormalities, inferior leads Artifact in lead(s) I II aVR aVL V5 artifact otherwise  no acute changed from old Confirmed by Arby BarrettePfeiffer, Scheryl Sanborn [phone]([id number]) on 05/04/2018 9:23:42 AM Also confirmed by Arby BarrettePfeiffer, Samreen Seltzer [phone]), editor Barbette Hairassel, Kerry [phone])  on 05/04/2018 10:10:39 AM   Radiology Ct Head Wo Contrast  Result Date: 05/04/2018 CLINICAL DATA:  Seizure with nausea and vomiting. Chronic altered mental status EXAM: CT HEAD WITHOUT CONTRAST TECHNIQUE: Contiguous axial  images were obtained from the base of the skull through the vertex without intravenous contrast. COMPARISON:  June 10, 2010 FINDINGS: Brain: There is stable mild diffuse atrophy. There is relative enlargement of the atrium of the right lateral ventricle compared to its counterpart on the left, felt to be secondary to ex vacuo phenomenon. There is no appreciable intracranial mass, hemorrhage, extra-axial fluid collection, or midline shift. 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 There is evidence of a prior infarct in the head of the caudate nucleus on the right. There is evidence of a prior infarct involving a portion of the body of the caudate nucleus on the left. There is evidence of a prior infarct in the left frontal lobe adjacent to the anterior horn of the left lateral ventricle. No acute appearing infarct is appreciable. Vascular: There is no appreciable hyperdense vessel. There is calcification in each carotid siphon region. Skull: The bony calvarium appears intact. Sinuses/Orbits: There is mucosal thickening in several ethmoid air cells. Other visualized paranasal sinuses are clear. Orbits appear symmetric bilaterally except for evident cataract removal on the right. Other: Mastoid air cells are clear. IMPRESSION: Atrophy with stable prior infarcts. No acute infarct evident. No mass or hemorrhage. Foci of arterial vascular calcification noted. There is mucosal thickening in several ethmoid air cells. Electronically Signed   By: Bretta Bang III M.D.   On: 05/04/2018 12:28   Ct  Abdomen Pelvis W Contrast  Result Date: 05/04/2018 CLINICAL DATA:  Nausea and vomiting. EXAM: CT ABDOMEN AND PELVIS WITH CONTRAST TECHNIQUE: Multidetector CT imaging of the abdomen and pelvis was performed using the standard protocol following bolus administration of intravenous contrast. CONTRAST:  75mL OMNIPAQUE IOHEXOL 300 MG/ML  SOLN COMPARISON:  None. FINDINGS: Lower chest: No acute abnormality. Hepatobiliary: There are 2 small cysts in the dome of the right lobe of the liver. Slight periportal edema, nonspecific. No focal liver lesions. Biliary tree is normal. Pancreas: Normal. Spleen: Normal. Adrenals/Urinary Tract: There is bilateral moderate hydronephrosis. The ureters are dilated to the level of the distended bladder. Bladder wall is slightly thickened. There is slight irregular and nodularity of the median lobe of the prostate gland. The patient has dilatation of the penile urethra. No visible catheter in place. Possibility of a urethral stenosis or a distal penile mass should be considered. Stomach/Bowel: No dilated loops of large or small bowel. Detail is suboptimal due to lack of contrast and lack of body fat. The appendix is visible and appears normal. Vascular/Lymphatic: Slight aortic atherosclerosis. No discrete adenopathy. Reproductive: Enlargement and irregularity and nodularity of the median lobe of the prostate gland. Seminal vesicles are normal. Other: No free air or free fluid in the abdomen. No discrete hernias. Musculoskeletal: No acute bone abnormality. IMPRESSION: 1. Bilateral hydronephrosis with distention of the bladder and dilatation of the penile urethra. I suspect there is either a distal penile mass or distal urethral stenosis. 2.  Aortic Atherosclerosis (ICD10-I70.0). 3. Irregular nodularity and enlargement of the median lobe of the prostate gland. 4. Nonspecific hepatic periportal edema. Electronically Signed   By: Francene Boyers M.D.   On: 05/04/2018 12:54   Dg Chest Port 1  View  Result Date: 05/04/2018 CLINICAL DATA:  Fever and vomiting EXAM: PORTABLE CHEST 1 VIEW COMPARISON:  06/10/2010 FINDINGS: Cardiac shadows within normal limits. Mild aortic calcifications are seen. The lungs are well aerated bilaterally. No focal infiltrate or sizable effusion is seen. No acute bony abnormality is noted. IMPRESSION: No  active disease. Electronically Signed   By: Alcide Clever M.D.   On: 05/04/2018 08:23    Procedures Procedures (including critical care time) CRITICAL CARE Performed by: Arby Barrette   Total critical care time: 60 minutes  Critical care time was exclusive of separately billable procedures and treating other patients.  Critical care was necessary to treat or prevent imminent or life-threatening deterioration.  Critical care was time spent personally by me on the following activities: development of treatment plan with patient and/or surrogate as well as nursing, discussions with consultants, evaluation of patient's response to treatment, examination of patient, obtaining history from patient or surrogate, ordering and performing treatments and interventions, ordering and review of laboratory studies, ordering and review of radiographic studies, pulse oximetry and re-evaluation of patient's condition. Medications Ordered in ED Medications  metroNIDAZOLE (FLAGYL) IVPB 500 mg (0 mg Intravenous Stopped 05/04/18 1237)  ceFEPIme (MAXIPIME) 1 g in sodium chloride 0.9 % 100 mL IVPB (has no administration in time range)  vancomycin (VANCOCIN) 500 mg in sodium chloride 0.9 % 100 mL IVPB (has no administration in time range)  lactated ringers infusion ( Intravenous New Bag/Given 05/04/18 1314)  sodium chloride 0.9 % bolus 500 mL (0 mLs Intravenous Stopped 05/04/18 0912)  sodium chloride 0.9 % bolus 1,000 mL (0 mLs Intravenous Stopped 05/04/18 1003)    And  sodium chloride 0.9 % bolus 1,000 mL (0 mLs Intravenous Stopped 05/04/18 1032)    And  sodium chloride 0.9 % bolus 250  mL (0 mLs Intravenous Stopped 05/04/18 1237)  ceFEPIme (MAXIPIME) 2 g in sodium chloride 0.9 % 100 mL IVPB (0 g Intravenous Stopped 05/04/18 1008)  vancomycin (VANCOCIN) IVPB 1000 mg/200 mL premix (0 mg Intravenous Stopped 05/04/18 1059)  acetaminophen (TYLENOL) suppository 650 mg (650 mg Rectal Given 05/04/18 0930)  iohexol (OMNIPAQUE) 300 MG/ML solution 75 mL (75 mLs Intravenous Contrast Given 05/04/18 1225)     Initial Impression / Assessment and Plan / ED Course  I have reviewed the triage vital signs and the nursing notes.  Pertinent labs & imaging results that were available during my care of the patient were reviewed by me and considered in my medical decision making (see chart for details).  Clinical Course as of May 04 1320  Fri May 04, 2018  1320 Consult: Dr. Willette Pa for admission to hospitalist service.   [MP]    Clinical Course User Index [MP] Arby Barrette, MD   Findings suggestive of sepsis.  Patient has positive urinalysis.  Febrile on arrival.  Elevated lactic acidosis.  Sepsis protocol initiated.  Patient also has suggestion of stricture or possible mass on CT scan at the bladder backslash penis.  Will admit for further evaluation and management.   Final Clinical Impressions(s) / ED Diagnoses   Final diagnoses:  Sepsis, due to unspecified organism, unspecified whether acute organ dysfunction present [facility])  Acute cystitis with hematuria    ED Discharge Orders    None       Arby Barrette, MD 05/04/18 1324

## 2018-05-04 NOTE — Progress Notes (Signed)
CRITICAL VALUE ALERT  Critical Value: Lactic acid 4.2  Date & Time Notied:  05/04/18 7:53 pm  Provider Notified: Rana Snare

## 2018-05-04 NOTE — ED Notes (Signed)
Bladder scanned pt. Monitor read 59mL. RN notified.

## 2018-05-04 NOTE — Progress Notes (Signed)
  Pt orientation to unit, room and routine. Information packet given to patient/family and safety video watched.  Admission INP armband ID verified with patient/family, and in place. SR up x 2, fall risk assessment complete with Patient and family verbalizing understanding of risks associated with falls. Pt verbalizes an understanding of how to use the call bell and to call for help before getting out of bed.  Skin, clean-dry- intact without evidence of bruising, or skin tears.   No evidence of skin break down noted on exam.        

## 2018-05-04 NOTE — Progress Notes (Signed)
Pharmacy Antibiotic Note  [name] is a 78 y.o. male admitted on 05/04/2018 with sepsis.  Pharmacy has been consulted for vancomycin and cefepime dosing.  Presented with seizure-like activity. WBC 1.7, LA 7.7. Temp 101.8. Scr 1.97 (CrCl 19 mL/min). Started on metronidazole, cefepime, and vanc for unknown source.   Plan: Continue metronidazole 500 mg IV every 8 hours Start cefepime 1 g IV every 24 hours Start vancomycin 1 g IV once then 500 mg IV every 48 hours Monitor renal function, cx results, clinical pic, and levels as appropriate  Height: 5\' 7"  (170.2 cm) Weight: 100 lb (45.4 kg) IBW/kg (Calculated) : 66.1  Temp (24hrs), Avg:101.8 F (38.8 C), Min:101.8 F (38.8 C), Max:101.8 F (38.8 C)  Recent Labs  Lab 05/04/18 0815  WBC 1.7*  CREATININE 1.97*  LATICACIDVEN 7.7*    Estimated Creatinine Clearance: 19.8 mL/min (A) (by C-G formula based on SCr of 1.97 mg/dL (H)).    No Known Allergies  Antimicrobials this admission: Vancomycin 2/7 >>  Cefepime 2/7 >>  Metronidazole 2/7>>  Dose adjustments this admission: N/A  Microbiology results: 2/7 BCx: sent  Thank you for allowing pharmacy to be a part of this patient's care.  Sherron Monday, PharmD, BCCCP Clinical Pharmacist  Pager: [phone] Phone: [phone] 05/04/2018 9:33 AM

## 2018-05-04 NOTE — Progress Notes (Signed)
CSW received call from [place] with [facility]. CSW advised that [name] would be at the ED around 10:30 to be with pt.     Claude Manges Johnte Portnoy, MSW, LCSW-A Emergency Department Clinical Social Worker [phone]

## 2018-05-04 NOTE — ED Notes (Signed)
Patient transported to CT 

## 2018-05-04 NOTE — Discharge Planning (Signed)
Pt active with [facility].  [place], SW with [facility] called for update on pt status.   Octavio Graves [phone] ([facility])

## 2018-05-05 ENCOUNTER — Inpatient Hospital Stay (HOSPITAL_COMMUNITY): Payer: Medicare (Managed Care)

## 2018-05-05 LAB — BASIC METABOLIC PANEL
Anion gap: 13 (ref 5–15)
BUN: 22 mg/dL (ref 8–23)
CO2: 21 mmol/L — ABNORMAL LOW (ref 22–32)
Calcium: 8.4 mg/dL — ABNORMAL LOW (ref 8.9–10.3)
Chloride: 112 mmol/L — ABNORMAL HIGH (ref 98–111)
Creatinine, Ser: 1.87 mg/dL — ABNORMAL HIGH (ref 0.61–1.24)
GFR calc Af Amer: 39 mL/min — ABNORMAL LOW (ref >60)
GFR calc non Af Amer: 34 mL/min — ABNORMAL LOW (ref >60)
Glucose, Bld: 104 mg/dL — ABNORMAL HIGH (ref 70–99)
Potassium: 3.5 mmol/L (ref 3.5–5.1)
SODIUM: 146 mmol/L — AB (ref 135–145)

## 2018-05-05 LAB — BLOOD CULTURE ID PANEL (REFLEXED)

## 2018-05-05 LAB — CORTISOL-AM, BLOOD: Cortisol - AM: 41.3 ug/dL — ABNORMAL HIGH (ref 6.7–22.6)

## 2018-05-05 LAB — GLUCOSE, CAPILLARY
GLUCOSE-CAPILLARY: 104 mg/dL — AB (ref 70–99)
Glucose-Capillary: 64 mg/dL — ABNORMAL LOW (ref 70–99)

## 2018-05-05 LAB — URINE CULTURE: Culture: <10000 — AB

## 2018-05-05 LAB — CBC
HCT: 35.1 % — ABNORMAL LOW (ref 39.0–52.0)
Hemoglobin: 11.6 g/dL — ABNORMAL LOW (ref 13.0–17.0)
MCH: 32.5 pg (ref 26.0–34.0)
MCHC: 33 g/dL (ref 30.0–36.0)
MCV: 98.3 fL (ref 80.0–100.0)
PLATELETS: 67 10*3/uL — AB (ref 150–400)
RBC: 3.57 MIL/uL — ABNORMAL LOW (ref 4.22–5.81)
RDW: 13.3 % (ref 11.5–15.5)
WBC: 21.2 10*3/uL — AB (ref 4.0–10.5)
nRBC: 0 % (ref 0.0–0.2)

## 2018-05-05 LAB — PROTIME-INR
INR: 1.56
Prothrombin Time: 18.5 seconds — ABNORMAL HIGH (ref 11.4–15.2)

## 2018-05-05 LAB — PROCALCITONIN: Procalcitonin: 105.15 ng/mL

## 2018-05-05 MED ORDER — DEXTROSE 50 % IV SOLN
25.0000 mL | Freq: Once | INTRAVENOUS | Status: AC
  Administered 2018-05-05: 25 mL via INTRAVENOUS

## 2018-05-05 MED ORDER — POTASSIUM CHLORIDE 2 MEQ/ML IV SOLN
INTRAVENOUS | Status: DC
  Administered 2018-05-05: 16:00:00 via INTRAVENOUS
  Filled 2018-05-05 (×3): qty 1000

## 2018-05-05 MED ORDER — FUROSEMIDE 10 MG/ML IJ SOLN
INTRAMUSCULAR | Status: AC
  Filled 2018-05-05: qty 4

## 2018-05-05 MED ORDER — FUROSEMIDE 10 MG/ML IJ SOLN
INTRAMUSCULAR | Status: AC
  Filled 2018-05-05: qty 2

## 2018-05-05 MED ORDER — SODIUM CHLORIDE 0.45 % IV SOLN
INTRAVENOUS | Status: DC
  Administered 2018-05-05: 1000 mL via INTRAVENOUS

## 2018-05-05 MED ORDER — SODIUM CHLORIDE 0.9 % IV SOLN
250.0000 mg | Freq: Two times a day (BID) | INTRAVENOUS | Status: DC
  Administered 2018-05-05 – 2018-05-09 (×9): 250 mg via INTRAVENOUS
  Filled 2018-05-05 (×10): qty 2.5

## 2018-05-05 MED ORDER — IPRATROPIUM-ALBUTEROL 0.5-2.5 (3) MG/3ML IN SOLN: 3.0000 mL | RESPIRATORY_TRACT | Status: DC | PRN

## 2018-05-05 MED ORDER — DEXTROSE 50 % IV SOLN
INTRAVENOUS | Status: AC
  Filled 2018-05-05: qty 50

## 2018-05-05 MED ORDER — SODIUM CHLORIDE 0.9 % IV SOLN
2.0000 g | INTRAVENOUS | Status: DC
  Administered 2018-05-05 – 2018-05-06 (×2): 2 g via INTRAVENOUS
  Filled 2018-05-05 (×2): qty 20

## 2018-05-05 MED ORDER — FUROSEMIDE 10 MG/ML IJ SOLN
60.0000 mg | Freq: Once | INTRAMUSCULAR | Status: AC
  Administered 2018-05-05: 60 mg via INTRAVENOUS

## 2018-05-05 NOTE — Progress Notes (Signed)
This AM patient's CBG was 64. He refused to have anything bu mouth. Per protocol patient received Dextrose 50% - 25 ml IV and CBG came up to 104. MD notified. Will continue to monitor.

## 2018-05-05 NOTE — Progress Notes (Addendum)
PROGRESS NOTE                                                                                                                                                                                                             Patient Demographics:    [name], is a 78 y.o. male, DOB - 12/07/1940, WUJ:[id number]  Admit date - 05/04/2018   Admitting Physician Lahoma Crockerheresa C Sheehan, MD  Outpatient Primary MD for the patient is Knox RoyaltyJones, Enrico, MD  LOS - 1  Chief Complaint  Patient presents with  . Seizures       Brief Narrative  [name] is a 78 y.o. 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  He was up this morning and was given breakfast.  He then went back to his room and was checked on by his family member and he was noted to be moving what look like a seizure-like activity.  Patient does have a seizure disorder and takes Keppra but he has not had a seizure since he moved to [place] 3 years ago.  He felt hot to the touch and concern was raised for a fever.    In the ER he was diagnosed with a UTI/pyelonephritis due to bladder outlet obstruction, he received a Foley catheter urology was consulted and he was admitted to the hospital.   Subjective:    [name] today has, No headache, No chest pain, No abdominal pain - No Nausea, No new weakness tingling or numbness, No Cough - SOB.     Assessment  & Plan :     1.  Sepsis due to UTI/pyelonephritis caused by bladder outlet obstruction.  Has received Foley, currently on Rocephin which will be continued, sepsis pathophysiology seems to have resolved with gentle hydration which will be continued as well, urology has been consulted to see the patient shortly.  Monitor cultures.  Clinically appears nontoxic family updated bedside.  2.  ARF.  Again due to bladder outlet obstruction.  Foley to decompress,  hydrate and monitor.  Avoid nephrotoxins.  Stop Toradol and ibuprofen.  3.  Leukopenia and thrombocytopenia.  Could be due to sepsis.  On heparin for prophylaxis, closely monitor CBC closely.  4.  Severe underlying mental retardation.  Supportive care.  5.  H/O seizures.  On IV Keppra.    Family Communication  :  daughter  Code Status :  DNR  Disposition Plan  :  TBD  Consults  :  Urology  Procedures  :    CT head and CXR - non acute  CT ABD- 1. Bilateral hydronephrosis with distention of the bladder and dilatation of the penile urethra. I suspect there is either a distal penile mass or distal urethral stenosis. 2.  Aortic Atherosclerosis (ICD10-I70.0). 3. Irregular nodularity and enlargement of the median lobe of the prostate gland. 4. Nonspecific hepatic periportal edema.   DVT Prophylaxis  :    Heparin    Lab Results  Component Value Date   PLT 67 (L) 05/05/2018    Diet :  Diet Order            DIET SOFT Room service appropriate? Yes; Fluid consistency: Thin  Diet effective now               Inpatient Medications Scheduled Meds: . dextrose      . dorzolamide  1 drop Both Eyes Daily  . feeding supplement (ENSURE ENLIVE)  237 mL Oral TID BM  . heparin  5,000 Units Subcutaneous Q8H  . latanoprost  1 drop Both Eyes QHS   Continuous Infusions: . sodium chloride 1,000 mL (05/05/18 0800)  . cefTRIAXone (ROCEPHIN)  IV 2 g (05/05/18 0804)  . levETIRAcetam 250 mg (05/05/18 0925)   PRN Meds:.acetaminophen **OR** [DISCONTINUED] acetaminophen, ibuprofen, ketorolac, polyethylene glycol  Antibiotics  :   Anti-infectives (From admission, onward)   Start     Dose/Rate Route Frequency Ordered Stop   05/06/18 1000  vancomycin (VANCOCIN) 500 mg in sodium chloride 0.9 % 100 mL IVPB  Status:  Discontinued     500 mg 100 mL/hr over 60 Minutes Intravenous Every 48 hours 05/04/18 0940 05/04/18 1521   05/05/18 1000  ceFEPIme (MAXIPIME) 1 g in sodium chloride 0.9 % 100 mL IVPB   Status:  Discontinued     1 g 200 mL/hr over 30 Minutes Intravenous Every 24 hours 05/04/18 0940 05/04/18 1521   05/05/18 0900  cefTRIAXone (ROCEPHIN) 1 g in sodium chloride 0.9 % 100 mL IVPB  Status:  Discontinued     1 g 200 mL/hr over 30 Minutes Intravenous Every 24 hours 05/04/18 1520 05/05/18 0316   05/05/18 0900  cefTRIAXone (ROCEPHIN) 2 g in sodium chloride 0.9 % 100 mL IVPB     2 g 200 mL/hr over 30 Minutes Intravenous Every 24 hours 05/05/18 0316     05/04/18 0915  ceFEPIme (MAXIPIME) 2 g in sodium chloride 0.9 % 100 mL IVPB     2 g 200 mL/hr over 30 Minutes Intravenous  Once 05/04/18 0909 05/04/18 1008   05/04/18 0915  metroNIDAZOLE (FLAGYL) IVPB 500 mg  Status:  Discontinued     500 mg 100 mL/hr over 60 Minutes Intravenous Every 8 hours 05/04/18 0909 05/04/18 1521   05/04/18 0915  vancomycin (VANCOCIN) IVPB 1000 mg/200 mL premix     1,000 mg 200 mL/hr over 60 Minutes Intravenous  Once 05/04/18 0909 05/04/18 1059          Objective:   Vitals:   05/04/18 1558 05/04/18 2111 05/05/18 0412 05/05/18 0454  BP: (!) 114/96 98/79  (!) 106/94  Pulse: (!) 119 (!) 104  94  Resp: 18 14  20   Temp: (!) 100.5 F (38.1 C) (!) 97.5 F (36.4 C)  97.8 F (36.6 C)  TempSrc: Oral Oral  Oral  SpO2: 98% 100%  98%  Weight:   54 kg  Height:        Wt Readings from Last 3 Encounters:  05/05/18 54 kg     Intake/Output Summary (Last 24 hours) at 05/05/2018 1141 Last data filed at 05/05/2018 0925 Gross per 24 hour  Intake 2017.8 ml  Output 600 ml  Net 1417.8 ml     Physical Exam  Awake - baseline agitated ( MR)  No new F.N deficits,   [place].AT,PERRAL Supple Neck,No JVD, No cervical lymphadenopathy appriciated.  Symmetrical Chest wall movement, Good air movement bilaterally, CTAB RRR,No Gallops,Rubs or new Murmurs, No Parasternal Heave +ve B.Sounds, Abd Soft, No tenderness, No organomegaly appriciated, No rebound - guarding or rigidity. Foley in place No Cyanosis, Clubbing or  edema, No new Rash or bruise       Data Review:    CBC Recent Labs  Lab 05/04/18 0815 05/05/18 0456  WBC 1.7* 21.2*  HGB 14.3 11.6*  HCT 44.6 35.1*  PLT 95* 67*  MCV 100.0 98.3  MCH 32.1 32.5  MCHC 32.1 33.0  RDW 12.9 13.3  LYMPHSABS 0.3*  --   MONOABS 0.0*  --   EOSABS 0.0  --   BASOSABS 0.0  --     Chemistries  Recent Labs  Lab 05/04/18 0815 05/05/18 0456  NA 144 146*  K 3.4* 3.5  CL 105 112*  CO2 20* 21*  GLUCOSE 95 104*  BUN 18 22  CREATININE 1.97* 1.87*  CALCIUM 9.4 8.4*  AST 36  --   ALT 16  --   ALKPHOS 88  --   BILITOT 1.9*  --    ------------------------------------------------------------------------------------------------------------------ No results for input(s): CHOL, HDL, LDLCALC, TRIG, CHOLHDL, LDLDIRECT in the last 72 hours.  Lab Results  Component Value Date   HGBA1C 5.8 (H) 05/04/2018   ------------------------------------------------------------------------------------------------------------------ Recent Labs    05/04/18 1809  TSH 0.713   ------------------------------------------------------------------------------------------------------------------ No results for input(s): VITAMINB12, FOLATE, FERRITIN, TIBC, IRON, RETICCTPCT in the last 72 hours.  Coagulation profile Recent Labs  Lab 05/05/18 0456  INR 1.56    No results for input(s): DDIMER in the last 72 hours.  Cardiac Enzymes No results for input(s): CKMB, TROPONINI, MYOGLOBIN in the last 168 hours.  Invalid input(s): CK ------------------------------------------------------------------------------------------------------------------ No results found for: BNP  Micro Results Recent Results (from the past 240 hour(s))  Culture, blood (routine x 2)     Status: None (Preliminary result)   Collection Time: 05/04/18  8:53 AM  Result Value Ref Range Status   Specimen Description BLOOD RIGHT FOREARM  Final   Special Requests BLOOD Blood Culture adequate volume  Final    Culture  Setup Time   Final    GRAM POSITIVE COCCI IN CHAINS IN BOTH AEROBIC AND ANAEROBIC BOTTLES Organism ID to follow CRITICAL RESULT CALLED TO, READ BACK BY AND VERIFIED WITH: K.AMEND,PHARMD AT 0307 ON 05/05/18 BY G.MCADOO Performed at [facility], [address], [place], [place] [address]    Culture PENDING  Incomplete   Report Status PENDING  Incomplete  Blood Culture ID Panel (Reflexed)     Status: Abnormal   Collection Time: 05/04/18  8:53 AM  Result Value Ref Range Status   Enterococcus species NOT DETECTED NOT DETECTED Final   Listeria monocytogenes NOT DETECTED NOT DETECTED Final   Staphylococcus species NOT DETECTED NOT DETECTED Final   Staphylococcus aureus (BCID) NOT DETECTED NOT DETECTED Final   Streptococcus species DETECTED (A) NOT DETECTED Final    Comment: Not Enterococcus species, Streptococcus agalactiae, Streptococcus pyogenes, or Streptococcus pneumoniae.  CRITICAL RESULT CALLED TO, READ BACK BY AND VERIFIED WITH: K.AMEND,PHARMD AT 0307 ON 05/05/18 BY G.MCADOO    Streptococcus agalactiae NOT DETECTED NOT DETECTED Final   Streptococcus pneumoniae NOT DETECTED NOT DETECTED Final   Streptococcus pyogenes NOT DETECTED NOT DETECTED Final   Acinetobacter baumannii NOT DETECTED NOT DETECTED Final   Enterobacteriaceae species NOT DETECTED NOT DETECTED Final   Enterobacter cloacae complex NOT DETECTED NOT DETECTED Final   Escherichia coli NOT DETECTED NOT DETECTED Final   Klebsiella oxytoca NOT DETECTED NOT DETECTED Final   Klebsiella pneumoniae NOT DETECTED NOT DETECTED Final   Proteus species NOT DETECTED NOT DETECTED Final   Serratia marcescens NOT DETECTED NOT DETECTED Final   Haemophilus influenzae NOT DETECTED NOT DETECTED Final   Neisseria meningitidis NOT DETECTED NOT DETECTED Final   Pseudomonas aeruginosa NOT DETECTED NOT DETECTED Final   Candida albicans NOT DETECTED NOT DETECTED Final   Candida glabrata NOT DETECTED NOT DETECTED Final   Candida  krusei NOT DETECTED NOT DETECTED Final   Candida parapsilosis NOT DETECTED NOT DETECTED Final   Candida tropicalis NOT DETECTED NOT DETECTED Final    Comment: Performed at [facility], [address] [place], [place] [address]  Urine culture     Status: Abnormal   Collection Time: 05/04/18  1:20 PM  Result Value Ref Range Status   Specimen Description URINE, RANDOM  Final   Special Requests NONE  Final   Culture (A)  Final    <10,000 COLONIES/mL INSIGNIFICANT GROWTH Performed at [facility], [address] [place], [place] [address]    Report Status 05/05/2018 FINAL  Final    Radiology Reports Ct Head Wo Contrast  Result Date: 05/04/2018 CLINICAL DATA:  Seizure with nausea and vomiting. Chronic altered mental status EXAM: CT HEAD WITHOUT CONTRAST TECHNIQUE: Contiguous axial images were obtained from the base of the skull through the vertex without intravenous contrast. COMPARISON:  June 10, 2010 FINDINGS: Brain: There is stable mild diffuse atrophy. There is relative enlargement of the atrium of the right lateral ventricle compared to its counterpart on the left, felt to be secondary to ex vacuo phenomenon. There is no appreciable intracranial mass, hemorrhage, extra-axial fluid collection, or midline shift. 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 There is evidence of a prior infarct in the head of the caudate nucleus on the right. There is evidence of a prior infarct involving a portion of the body of the caudate nucleus on the left. There is evidence of a prior infarct in the left frontal lobe adjacent to the anterior horn of the left lateral ventricle. No acute appearing infarct is appreciable. Vascular: There is no appreciable hyperdense vessel. There is calcification in each carotid siphon region. Skull: The bony calvarium appears intact. Sinuses/Orbits: There is mucosal thickening in several ethmoid air cells. Other visualized  paranasal sinuses are clear. Orbits appear symmetric bilaterally except for evident cataract removal on the right. Other: Mastoid air cells are clear. IMPRESSION: Atrophy with stable prior infarcts. No acute infarct evident. No mass or hemorrhage. Foci of arterial vascular calcification noted. There is mucosal thickening in several ethmoid air cells. Electronically Signed   By: Bretta BangWilliam  Woodruff III M.D.   On: 05/04/2018 12:28   Ct Abdomen Pelvis W Contrast  Result Date: 05/04/2018 CLINICAL DATA:  Nausea and vomiting. EXAM: CT ABDOMEN AND PELVIS WITH CONTRAST TECHNIQUE: Multidetector CT imaging of the abdomen and pelvis was performed using the standard protocol following  bolus administration of intravenous contrast. CONTRAST:  75mL OMNIPAQUE IOHEXOL 300 MG/ML  SOLN COMPARISON:  None. FINDINGS: Lower chest: No acute abnormality. Hepatobiliary: There are 2 small cysts in the dome of the right lobe of the liver. Slight periportal edema, nonspecific. No focal liver lesions. Biliary tree is normal. Pancreas: Normal. Spleen: Normal. Adrenals/Urinary Tract: There is bilateral moderate hydronephrosis. The ureters are dilated to the level of the distended bladder. Bladder wall is slightly thickened. There is slight irregular and nodularity of the median lobe of the prostate gland. The patient has dilatation of the penile urethra. No visible catheter in place. Possibility of a urethral stenosis or a distal penile mass should be considered. Stomach/Bowel: No dilated loops of large or small bowel. Detail is suboptimal due to lack of contrast and lack of body fat. The appendix is visible and appears normal. Vascular/Lymphatic: Slight aortic atherosclerosis. No discrete adenopathy. Reproductive: Enlargement and irregularity and nodularity of the median lobe of the prostate gland. Seminal vesicles are normal. Other: No free air or free fluid in the abdomen. No discrete hernias. Musculoskeletal: No acute bone abnormality.  IMPRESSION: 1. Bilateral hydronephrosis with distention of the bladder and dilatation of the penile urethra. I suspect there is either a distal penile mass or distal urethral stenosis. 2.  Aortic Atherosclerosis (ICD10-I70.0). 3. Irregular nodularity and enlargement of the median lobe of the prostate gland. 4. Nonspecific hepatic periportal edema. Electronically Signed   By: Francene Boyers M.D.   On: 05/04/2018 12:54   Dg Chest Port 1 View  Result Date: 05/04/2018 CLINICAL DATA:  Fever and vomiting EXAM: PORTABLE CHEST 1 VIEW COMPARISON:  06/10/2010 FINDINGS: Cardiac shadows within normal limits. Mild aortic calcifications are seen. The lungs are well aerated bilaterally. No focal infiltrate or sizable effusion is seen. No acute bony abnormality is noted. IMPRESSION: No active disease. Electronically Signed   By: Alcide Clever M.D.   On: 05/04/2018 08:23    Time Spent in minutes  30   Susa Raring M.D on 05/05/2018 at 11:41 AM  To page go to [email or web address] - password [facility]

## 2018-05-05 NOTE — Progress Notes (Signed)
PHARMACY - PHYSICIAN COMMUNICATION CRITICAL VALUE ALERT - BLOOD CULTURE IDENTIFICATION (BCID)  [name] is an 78 y.o. male who presented to [facility]04/2018 with a chief complaint of not behaving as his usual self and possible seizure.  Assessment:  Pt growing streptococcus in 1/4 blood cultures (not strep pnemo, agalactiae, pyogenes, pneumonia) - likely source - urine  Name of physician (or Provider) Contacted: Bodenheimer, NP  Current antibiotics: Ceftriaxone 1gm IV q24h  Changes to prescribed antibiotics recommended:  Increase Ceftriaxone to 2gm IV q24h  Results for orders placed or performed during the hospital encounter of 05/04/18  Blood Culture ID Panel (Reflexed) (Collected: 05/04/2018  8:53 AM)  Result Value Ref Range   Enterococcus species NOT DETECTED NOT DETECTED   Listeria monocytogenes NOT DETECTED NOT DETECTED   Staphylococcus species NOT DETECTED NOT DETECTED   Staphylococcus aureus (BCID) NOT DETECTED NOT DETECTED   Streptococcus species DETECTED (A) NOT DETECTED   Streptococcus agalactiae NOT DETECTED NOT DETECTED   Streptococcus pneumoniae NOT DETECTED NOT DETECTED   Streptococcus pyogenes NOT DETECTED NOT DETECTED   Acinetobacter baumannii NOT DETECTED NOT DETECTED   Enterobacteriaceae species NOT DETECTED NOT DETECTED   Enterobacter cloacae complex NOT DETECTED NOT DETECTED   Escherichia coli NOT DETECTED NOT DETECTED   Klebsiella oxytoca NOT DETECTED NOT DETECTED   Klebsiella pneumoniae NOT DETECTED NOT DETECTED   Proteus species NOT DETECTED NOT DETECTED   Serratia marcescens NOT DETECTED NOT DETECTED   Haemophilus influenzae NOT DETECTED NOT DETECTED   Neisseria meningitidis NOT DETECTED NOT DETECTED   Pseudomonas aeruginosa NOT DETECTED NOT DETECTED   Candida albicans NOT DETECTED NOT DETECTED   Candida glabrata NOT DETECTED NOT DETECTED   Candida krusei NOT DETECTED NOT DETECTED   Candida parapsilosis NOT DETECTED NOT DETECTED   Candida  tropicalis NOT DETECTED NOT DETECTED    [name], PharmD, BCPS Clinical pharmacist  **Pharmacist phone directory can now be found on [email or web address] ([email or web address]).  Listed under [facility]. 05/05/2018  3:13 AM

## 2018-05-05 NOTE — Progress Notes (Signed)
Called to assess patient at this time, placed on NRB and lasix given. MD aware.

## 2018-05-05 NOTE — Consult Note (Signed)
I have been asked to see the patient by Dr. Susa Raring, for evaluation and management of urinary retention.  History of present illness: 78 year old male who was brought to the emergency department by EMS with history of cognitive delay and epilepsy with altered mental status and unusual activity.  In the emergency department he was noted to be febrile with an elevated lactate as well as creatinine.  CT scan was performed demonstrating a distended bladder with bilateral hydroureteronephrosis.  Urine analysis was concerning for infection.  He was subsequently admitted for urosepsis.  A Foley catheter was placed.  CT scan also demonstrated an area within the bulbous urethra that was edematous and concerning for stricture.  Further, there was concern that the patient may also have a small mass at the base of his bladder.  Nonetheless, catheter was placed and clear yellow urine was obtained.  Blood cultures have grown Streptococcus.  The urine culture was largely unremarkable.  Review of systems: A 12 point comprehensive review of systems was obtained and is negative unless otherwise stated in the history of present illness.  Patient Active Problem List   Diagnosis Date Noted  . Sepsis due to gram-negative UTI (HCC) 05/04/2018  . Developmental delay 05/04/2018  . Acute kidney injury (AKI) with acute tubular necrosis (ATN) (HCC) 05/04/2018  . Hypokalemia 05/04/2018  . 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 Enlarged prostate with lower urinary tract symptoms (LUTS) 05/04/2018  . Leukopenia 05/04/2018  . Thrombocytopenia (HCC) 05/04/2018  . Bilateral hydronephrosis 05/04/2018    No current facility-administered medications on file prior to encounter.    Current Outpatient Medications on File Prior to Encounter  Medication Sig Dispense Refill  . bimatoprost (LUMIGAN) 0.01 % SOLN Place 1 drop into both eyes at bedtime.    . dorzolamide (TRUSOPT) 2 % ophthalmic solution Place 1 drop into both eyes  daily.    Marland Kitchen ENSURE PLUS (ENSURE PLUS) LIQD Take 237 mLs by mouth 3 (three) times daily between meals.    . levETIRAcetam (KEPPRA) 250 MG tablet Take 250 mg by mouth 2 (two) times daily.      Past Medical History:  Diagnosis Date  . Blindness   . Mentally disabled     History reviewed. No pertinent surgical history.  Social History   Tobacco Use  . Smoking status: Never Smoker  . Smokeless tobacco: Never Used  Substance Use Topics  . Alcohol use: Not on file  . Drug use: Not on file    Family History  Problem Relation Age of Onset  . Prostate cancer Brother     PE: Vitals:   05/04/18 1558 05/04/18 2111 05/05/18 0412 05/05/18 0454  BP: (!) 114/96 98/79  (!) 106/94  Pulse: (!) 119 (!) 104  94  Resp: 18 14  20   Temp: (!) 100.5 F (38.1 C) (!) 97.5 F (36.4 C)  97.8 F (36.6 C)  TempSrc: Oral Oral  Oral  SpO2: 98% 100%  98%  Weight:   54 kg   Height:       Patient appears to be in no acute distress  Patient with eyes closed but arousable.  He is communicative somewhat.  Answers questions with very short answers. Atraumatic normocephalic head No cervical or supraclavicular lymphadenopathy appreciated No increased work of breathing, no audible wheezes/rhonchi Regular sinus rhythm/rate Abdomen is soft, nontender, nondistended, no CVA The patient does have some mild suprapubic tenderness.  The Foley catheter is in place, strapped to his leg and draining clear yellow urine Lower  extremities are symmetric without appreciable edema Grossly neurologically intact No identifiable skin lesions  Recent Labs    05/04/18 0815 05/05/18 0456  WBC 1.7* 21.2*  HGB 14.3 11.6*  HCT 44.6 35.1*   Recent Labs    05/04/18 0815 05/05/18 0456  NA 144 146*  K 3.4* 3.5  CL 105 112*  CO2 20* 21*  GLUCOSE 95 104*  BUN 18 22  CREATININE 1.97* 1.87*  CALCIUM 9.4 8.4*   Recent Labs    05/05/18 0456  INR 1.56   No results for input(s): LABURIN in the last 72 hours. Results  for orders placed or performed during the hospital encounter of 05/04/18  Culture, blood (routine x 2)     Status: None (Preliminary result)   Collection Time: 05/04/18  8:53 AM  Result Value Ref Range Status   Specimen Description BLOOD RIGHT FOREARM  Final   Special Requests BLOOD Blood Culture adequate volume  Final   Culture  Setup Time   Final    GRAM POSITIVE COCCI IN CHAINS IN BOTH AEROBIC AND ANAEROBIC BOTTLES Organism ID to follow CRITICAL RESULT CALLED TO, READ BACK BY AND VERIFIED WITH: K.AMEND,PHARMD AT 0307 ON 05/05/18 BY G.[email or web address] Performed at [facility], [address] [place], [place] [address]    Culture PENDING  Incomplete   Report Status PENDING  Incomplete  Blood Culture ID Panel (Reflexed)     Status: Abnormal   Collection Time: 05/04/18  8:53 AM  Result Value Ref Range Status   Enterococcus species NOT DETECTED NOT DETECTED Final   Listeria monocytogenes NOT DETECTED NOT DETECTED Final   Staphylococcus species NOT DETECTED NOT DETECTED Final   Staphylococcus aureus (BCID) NOT DETECTED NOT DETECTED Final   Streptococcus species DETECTED (A) NOT DETECTED Final    Comment: Not Enterococcus species, Streptococcus agalactiae, Streptococcus pyogenes, or Streptococcus pneumoniae. CRITICAL RESULT CALLED TO, READ BACK BY AND VERIFIED WITH: K.AMEND,PHARMD AT 0307 ON 05/05/18 BY G.MCADOO    Streptococcus agalactiae NOT DETECTED NOT DETECTED Final   Streptococcus pneumoniae NOT DETECTED NOT DETECTED Final   Streptococcus pyogenes NOT DETECTED NOT DETECTED Final   Acinetobacter baumannii NOT DETECTED NOT DETECTED Final   Enterobacteriaceae species NOT DETECTED NOT DETECTED Final   Enterobacter cloacae complex NOT DETECTED NOT DETECTED Final   Escherichia coli NOT DETECTED NOT DETECTED Final   Klebsiella oxytoca NOT DETECTED NOT DETECTED Final   Klebsiella pneumoniae NOT DETECTED NOT DETECTED Final   Proteus species NOT DETECTED NOT DETECTED Final   Serratia  marcescens NOT DETECTED NOT DETECTED Final   Haemophilus influenzae NOT DETECTED NOT DETECTED Final   Neisseria meningitidis NOT DETECTED NOT DETECTED Final   Pseudomonas aeruginosa NOT DETECTED NOT DETECTED Final   Candida albicans NOT DETECTED NOT DETECTED Final   Candida glabrata NOT DETECTED NOT DETECTED Final   Candida krusei NOT DETECTED NOT DETECTED Final   Candida parapsilosis NOT DETECTED NOT DETECTED Final   Candida tropicalis NOT DETECTED NOT DETECTED Final    Comment: Performed at [facility], [address] [place], [place] [address]  Urine culture     Status: Abnormal   Collection Time: 05/04/18  1:20 PM  Result Value Ref Range Status   Specimen Description URINE, RANDOM  Final   Special Requests NONE  Final   Culture (A)  Final    <10,000 COLONIES/mL INSIGNIFICANT GROWTH Performed at [facility], [address] [place], [place] [address]    Report Status 05/05/2018 FINAL  Final    Imaging: I have reviewed the patient's CT scan with the findings as noted in the HPI.  Imp: The patient developed urinary retention and likely an associated urinary tract infection that progressed to pyelonephritis and subsequent bacteremia.  Currently he is stabilized and his lactate is trending down.  He is more comfortable with the Foley catheter placed.  His vital signs are stable.  The patient's findings of a urethral stricture as well as the area in his bladder should be further evaluated as an outpatient.   Recommendations:  At this point, he needs a Foley catheter for at least 2 weeks.  We will have him follow-up in clinic for additional work-up.  I would suggest that the patient be placed on tamsulosin 0.4 mg starting now so that at the time of his Foley catheter removal he is set up for success.  He will need to be treated for his bacteremia for 2 weeks.  I will schedule him for a voiding trial in the urology clinic in about 2 weeks time.   Thank you for involving me  in this patient's care, Please page with any further questions or concerns. Crist Fat

## 2018-05-06 LAB — BASIC METABOLIC PANEL
Anion gap: 14 (ref 5–15)
BUN: 23 mg/dL (ref 8–23)
CHLORIDE: 114 mmol/L — AB (ref 98–111)
CO2: 19 mmol/L — ABNORMAL LOW (ref 22–32)
Calcium: 8.5 mg/dL — ABNORMAL LOW (ref 8.9–10.3)
Creatinine, Ser: 1.76 mg/dL — ABNORMAL HIGH (ref 0.61–1.24)
GFR calc Af Amer: 42 mL/min — ABNORMAL LOW (ref >60)
GFR calc non Af Amer: 36 mL/min — ABNORMAL LOW (ref >60)
Glucose, Bld: 87 mg/dL (ref 70–99)
POTASSIUM: 3.5 mmol/L (ref 3.5–5.1)
Sodium: 147 mmol/L — ABNORMAL HIGH (ref 135–145)

## 2018-05-06 LAB — MAGNESIUM: Magnesium: 1.7 mg/dL (ref 1.7–2.4)

## 2018-05-06 LAB — PROCALCITONIN: Procalcitonin: 60.76 ng/mL

## 2018-05-06 LAB — CBC
HCT: 41.7 % (ref 39.0–52.0)
Hemoglobin: 14.3 g/dL (ref 13.0–17.0)
MCH: 32.7 pg (ref 26.0–34.0)
MCHC: 34.3 g/dL (ref 30.0–36.0)
MCV: 95.4 fL (ref 80.0–100.0)
NRBC: 0.1 % (ref 0.0–0.2)
Platelets: 74 10*3/uL — ABNORMAL LOW (ref 150–400)
RBC: 4.37 MIL/uL (ref 4.22–5.81)
RDW: 13.5 % (ref 11.5–15.5)
WBC: 25.4 10*3/uL — ABNORMAL HIGH (ref 4.0–10.5)

## 2018-05-06 LAB — GLUCOSE, CAPILLARY: Glucose-Capillary: 91 mg/dL (ref 70–99)

## 2018-05-06 MED ORDER — DEXTROSE 5 % IV SOLN
INTRAVENOUS | Status: AC
  Administered 2018-05-06: 1000 mL via INTRAVENOUS

## 2018-05-06 MED ORDER — SODIUM CHLORIDE 0.9 % IV SOLN
3.0000 g | Freq: Two times a day (BID) | INTRAVENOUS | Status: DC
  Filled 2018-05-06: qty 3

## 2018-05-06 MED ORDER — SODIUM CHLORIDE 0.9 % IV SOLN: 1.0000 g | INTRAVENOUS | Status: DC

## 2018-05-06 MED ORDER — TAMSULOSIN HCL 0.4 MG PO CAPS
0.4000 mg | ORAL_CAPSULE | Freq: Every day | ORAL | Status: DC
  Administered 2018-05-06 – 2018-05-07 (×2): 0.4 mg via ORAL
  Filled 2018-05-06 (×2): qty 1

## 2018-05-06 MED ORDER — METRONIDAZOLE IN NACL 5-0.79 MG/ML-% IV SOLN
500.0000 mg | Freq: Three times a day (TID) | INTRAVENOUS | Status: DC
  Administered 2018-05-06 – 2018-05-09 (×10): 500 mg via INTRAVENOUS
  Filled 2018-05-06 (×10): qty 100

## 2018-05-06 MED ORDER — SODIUM CHLORIDE 0.9 % IV SOLN
2.0000 g | INTRAVENOUS | Status: DC
  Administered 2018-05-07 – 2018-05-09 (×3): 2 g via INTRAVENOUS
  Filled 2018-05-06 (×3): qty 20

## 2018-05-06 NOTE — Progress Notes (Signed)
PHARMACY - PHYSICIAN COMMUNICATION CRITICAL VALUE ALERT - BLOOD CULTURE IDENTIFICATION (BCID)  [name] is an 78 y.o. male who presented to [facility] on 05/04/2018 with a chief complaint of on not behaving as usual self and possible seizure.   Assessment:  Pt was growing streptococcus in 1/4 blood cultures (not strep pnemo, agalactiae, pyogenes, pneumonia). Now also growing GNRs in 1/4 bottles. No BCID yet.    Name of physician (or Provider) Contacted: Dr. Thedore Mins  Current antibiotics: Ceftriaxone/unasyn   Changes to prescribed antibiotics recommended: Continue ceftriaxone 2 gm IV Q 24h, add flagyl and stop Unasyn to cover UTI, aspiration and bacteremia  Recommendations accepted by provider  Results for orders placed or performed during the hospital encounter of 05/04/18  Blood Culture ID Panel (Reflexed) (Collected: 05/04/2018  8:53 AM)  Result Value Ref Range   Enterococcus species NOT DETECTED NOT DETECTED   Listeria monocytogenes NOT DETECTED NOT DETECTED   Staphylococcus species NOT DETECTED NOT DETECTED   Staphylococcus aureus (BCID) NOT DETECTED NOT DETECTED   Streptococcus species DETECTED (A) NOT DETECTED   Streptococcus agalactiae NOT DETECTED NOT DETECTED   Streptococcus pneumoniae NOT DETECTED NOT DETECTED   Streptococcus pyogenes NOT DETECTED NOT DETECTED   Acinetobacter baumannii NOT DETECTED NOT DETECTED   Enterobacteriaceae species NOT DETECTED NOT DETECTED   Enterobacter cloacae complex NOT DETECTED NOT DETECTED   Escherichia coli NOT DETECTED NOT DETECTED   Klebsiella oxytoca NOT DETECTED NOT DETECTED   Klebsiella pneumoniae NOT DETECTED NOT DETECTED   Proteus species NOT DETECTED NOT DETECTED   Serratia marcescens NOT DETECTED NOT DETECTED   Haemophilus influenzae NOT DETECTED NOT DETECTED   Neisseria meningitidis NOT DETECTED NOT DETECTED   Pseudomonas aeruginosa NOT DETECTED NOT DETECTED   Candida albicans NOT DETECTED NOT DETECTED   Candida glabrata NOT  DETECTED NOT DETECTED   Candida krusei NOT DETECTED NOT DETECTED   Candida parapsilosis NOT DETECTED NOT DETECTED   Candida tropicalis NOT DETECTED NOT DETECTED    Vinnie Level, PharmD., BCPS Clinical Pharmacist Clinical phone for 05/06/18 until 8:30pm: [phone] If after 8:30pm, please refer to [facility] for unit-specific pharmacist

## 2018-05-06 NOTE — Progress Notes (Signed)
Called by pts family that pt is having difficulty breathing. Assessed pt. O2 sats 80% on RA. Placed pt on 6L [place] and O2 sats only rose to 84%. Placed pt on 15 L non-rebreather mask. O2 sats rose to 92%. Called RRT to assess pt. MD notified. Administered 60 mg IV lasix and Chest x-ray ordered. Will continue to monitor.

## 2018-05-06 NOTE — Progress Notes (Signed)
PROGRESS NOTE                                                                                                                                                                                                             Patient Demographics:    [name], is a 78 y.o. male, DOB - 09/13/1940, WGN:[id number]  Admit date - 05/04/2018   Admitting Physician Lahoma Crockerheresa C Sheehan, MD  Outpatient Primary MD for the patient is Knox RoyaltyJones, Enrico, MD  LOS - 2  Chief Complaint  Patient presents with  . Seizures       Brief Narrative  [name] is a 78 y.o. 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  He was up this morning and was given breakfast.  He then went back to his room and was checked on by his family member and he was noted to be moving what look like a seizure-like activity.  Patient does have a seizure disorder and takes Keppra but he has not had a seizure since he moved to [place] 3 years ago.  He felt hot to the touch and concern was raised for a fever.    In the ER he was diagnosed with a UTI/pyelonephritis due to bladder outlet obstruction, he received a Foley catheter urology was consulted and he was admitted to the hospital.   Subjective:   Patient in bed, appears comfortable, mild cough.    Assessment  & Plan :     1.  Sepsis due to UTI/pyelonephritis caused by bladder outlet obstruction.  He has received Foley catheter here, Flomax added, sepsis pathophysiology has resolved with hydration, currently on Rocephin from UTI perspective, cultures inconclusive.  Seen by urologist Dr. headache wants to follow outpatient in 1 to 2 weeks.    2.  ARF.  Again due to bladder outlet obstruction.  Foley to decompress, hydrate and monitor.  Avoid nephrotoxins.  Stopped Toradol and ibuprofen.  3. Thrombocytopenia.  Could be due to sepsis.  On heparin  for prophylaxis, closely monitor CBC closely.  4.  Severe underlying mental retardation.  Supportive care.  5.  H/O seizures.  On IV Keppra.  6.  Cough and hypoxia developing 05/06/2018.  Likely multifocal pneumonia versus aspiration pneumonia, speech to evaluate, soft diet, Unasyn added to Rocephin will monitor. Procal ++ high.  Cultures 1 out of 2 growing strep.  Family Communication  :  daughter  Code Status :  DNR  Disposition Plan  :  TBD  Consults  :  Urology  Procedures  :    CT head and CXR - non acute  CT ABD- 1. Bilateral hydronephrosis with distention of the bladder and dilatation of the penile urethra. I suspect there is either a distal penile mass or distal urethral stenosis. 2.  Aortic Atherosclerosis (ICD10-I70.0). 3. Irregular nodularity and enlargement of the median lobe of the prostate gland. 4. Nonspecific hepatic periportal edema.   DVT Prophylaxis  :    Heparin    Lab Results  Component Value Date   PLT 74 (L) 05/06/2018    Diet :  Diet Order            DIET SOFT Room service appropriate? Yes; Fluid consistency: Thin  Diet effective now               Inpatient Medications Scheduled Meds: . dorzolamide  1 drop Both Eyes Daily  . feeding supplement (ENSURE ENLIVE)  237 mL Oral TID BM  . furosemide      . furosemide      . heparin  5,000 Units Subcutaneous Q8H  . latanoprost  1 drop Both Eyes QHS   Continuous Infusions: . dextrose    . levETIRAcetam 250 mg (05/06/18 0935)   PRN Meds:.acetaminophen **OR** [DISCONTINUED] acetaminophen, ipratropium-albuterol, polyethylene glycol  Antibiotics  :   Anti-infectives (From admission, onward)   Start     Dose/Rate Route Frequency Ordered Stop   05/06/18 1000  vancomycin (VANCOCIN) 500 mg in sodium chloride 0.9 % 100 mL IVPB  Status:  Discontinued     500 mg 100 mL/hr over 60 Minutes Intravenous Every 48 hours 05/04/18 0940 05/04/18 1521   05/05/18 1000  ceFEPIme (MAXIPIME) 1 g in sodium chloride  0.9 % 100 mL IVPB  Status:  Discontinued     1 g 200 mL/hr over 30 Minutes Intravenous Every 24 hours 05/04/18 0940 05/04/18 1521   05/05/18 0900  cefTRIAXone (ROCEPHIN) 1 g in sodium chloride 0.9 % 100 mL IVPB  Status:  Discontinued     1 g 200 mL/hr over 30 Minutes Intravenous Every 24 hours 05/04/18 1520 05/05/18 0316   05/05/18 0900  cefTRIAXone (ROCEPHIN) 2 g in sodium chloride 0.9 % 100 mL IVPB  Status:  Discontinued     2 g 200 mL/hr over 30 Minutes Intravenous Every 24 hours 05/05/18 0316 05/06/18 0949   05/04/18 0915  ceFEPIme (MAXIPIME) 2 g in sodium chloride 0.9 % 100 mL IVPB     2 g 200 mL/hr over 30 Minutes Intravenous  Once 05/04/18 0909 05/04/18 1008   05/04/18 0915  metroNIDAZOLE (FLAGYL) IVPB 500 mg  Status:  Discontinued     500 mg 100 mL/hr over 60 Minutes Intravenous Every 8 hours 05/04/18 0909 05/04/18 1521   05/04/18 0915  vancomycin (VANCOCIN) IVPB 1000 mg/200 mL premix     1,000 mg 200 mL/hr over 60 Minutes Intravenous  Once 05/04/18 0909 05/04/18 1059        Objective:   Vitals:   05/05/18 2343 05/06/18 0138 05/06/18 0531 05/06/18 0757  BP: (!) 163/130  (!) 140/100 (!) 142/118  Pulse: (!) 119 (!) 111 (!) 107   Resp: (!) 25 18 (!) 32 (!) 22  Temp:   (!) 100.4 F (38 C) 99.9 F (37.7 C)  TempSrc:   Oral Oral  SpO2: 90% 91% 97% 99%  Weight:  Height:        Wt Readings from Last 3 Encounters:  05/05/18 54 kg     Intake/Output Summary (Last 24 hours) at 05/06/2018 0950 Last data filed at 05/06/2018 0935 Gross per 24 hour  Intake 1513.33 ml  Output 2025 ml  Net -511.67 ml     Physical Exam  Awake - baseline agitated ( MR), Foley in place  [place].AT,PERRAL Supple Neck,No JVD, No cervical lymphadenopathy appriciated.  Symmetrical Chest wall movement, Good air movement bilaterally, CTAB RRR,No Gallops, Rubs or new Murmurs, No Parasternal Heave +ve B.Sounds, Abd Soft, No tenderness, No organomegaly appriciated, No rebound - guarding or  rigidity. No Cyanosis, Clubbing or edema, No new Rash or bruise      Data Review:    CBC Recent Labs  Lab 05/04/18 0815 05/05/18 0456 05/06/18 0434  WBC 1.7* 21.2* 25.4*  HGB 14.3 11.6* 14.3  HCT 44.6 35.1* 41.7  PLT 95* 67* 74*  MCV 100.0 98.3 95.4  MCH 32.1 32.5 32.7  MCHC 32.1 33.0 34.3  RDW 12.9 13.3 13.5  LYMPHSABS 0.3*  --   --   MONOABS 0.0*  --   --   EOSABS 0.0  --   --   BASOSABS 0.0  --   --     Chemistries  Recent Labs  Lab 05/04/18 0815 05/05/18 0456 05/06/18 0434  NA 144 146* 147*  K 3.4* 3.5 3.5  CL 105 112* 114*  CO2 20* 21* 19*  GLUCOSE 95 104* 87  BUN 18 22 23   CREATININE 1.97* 1.87* 1.76*  CALCIUM 9.4 8.4* 8.5*  MG  --   --  1.7  AST 36  --   --   ALT 16  --   --   ALKPHOS 88  --   --   BILITOT 1.9*  --   --    ------------------------------------------------------------------------------------------------------------------ No results for input(s): CHOL, HDL, LDLCALC, TRIG, CHOLHDL, LDLDIRECT in the last 72 hours.  Lab Results  Component Value Date   HGBA1C 5.8 (H) 05/04/2018   ------------------------------------------------------------------------------------------------------------------ Recent Labs    05/04/18 1809  TSH 0.713   ------------------------------------------------------------------------------------------------------------------ No results for input(s): VITAMINB12, FOLATE, FERRITIN, TIBC, IRON, RETICCTPCT in the last 72 hours.  Coagulation profile Recent Labs  Lab 05/05/18 0456  INR 1.56    No results for input(s): DDIMER in the last 72 hours.  Cardiac Enzymes No results for input(s): CKMB, TROPONINI, MYOGLOBIN in the last 168 hours.  Invalid input(s): CK ------------------------------------------------------------------------------------------------------------------ No results found for: BNP  Micro Results Recent Results (from the past 240 hour(s))  Culture, blood (routine x 2)     Status: None  (Preliminary result)   Collection Time: 05/04/18  8:43 AM  Result Value Ref Range Status   Specimen Description BLOOD SITE NOT SPECIFIED  Final   Special Requests   Final    BOTTLES DRAWN AEROBIC AND ANAEROBIC Blood Culture adequate volume   Culture   Final    NO GROWTH 1 DAY Performed at [facility], [address] [place], [place] [address]    Report Status PENDING  Incomplete  Culture, blood (routine x 2)     Status: None (Preliminary result)   Collection Time: 05/04/18  8:53 AM  Result Value Ref Range Status   Specimen Description BLOOD RIGHT FOREARM  Final   Special Requests BLOOD Blood Culture adequate volume  Final   Culture  Setup Time   Final    GRAM POSITIVE COCCI IN CHAINS IN BOTH  AEROBIC AND ANAEROBIC BOTTLES Organism ID to follow CRITICAL RESULT CALLED TO, READ BACK BY AND VERIFIED WITH: K.AMEND,PHARMD AT 0307 ON 05/05/18 BY G.[email or web address]    Culture   Final    NO GROWTH 1 DAY Performed at [facility], [address] [place], [place] [address]    Report Status PENDING  Incomplete  Blood Culture ID Panel (Reflexed)     Status: Abnormal   Collection Time: 05/04/18  8:53 AM  Result Value Ref Range Status   Enterococcus species NOT DETECTED NOT DETECTED Final   Listeria monocytogenes NOT DETECTED NOT DETECTED Final   Staphylococcus species NOT DETECTED NOT DETECTED Final   Staphylococcus aureus (BCID) NOT DETECTED NOT DETECTED Final   Streptococcus species DETECTED (A) NOT DETECTED Final    Comment: Not Enterococcus species, Streptococcus agalactiae, Streptococcus pyogenes, or Streptococcus pneumoniae. CRITICAL RESULT CALLED TO, READ BACK BY AND VERIFIED WITH: K.AMEND,PHARMD AT 0307 ON 05/05/18 BY G.[email or web address]    Streptococcus agalactiae NOT DETECTED NOT DETECTED Final   Streptococcus pneumoniae NOT DETECTED NOT DETECTED Final   Streptococcus pyogenes NOT DETECTED NOT DETECTED Final   Acinetobacter baumannii NOT DETECTED NOT DETECTED Final   Enterobacteriaceae  species NOT DETECTED NOT DETECTED Final   Enterobacter cloacae complex NOT DETECTED NOT DETECTED Final   Escherichia coli NOT DETECTED NOT DETECTED Final   Klebsiella oxytoca NOT DETECTED NOT DETECTED Final   Klebsiella pneumoniae NOT DETECTED NOT DETECTED Final   Proteus species NOT DETECTED NOT DETECTED Final   Serratia marcescens NOT DETECTED NOT DETECTED Final   Haemophilus influenzae NOT DETECTED NOT DETECTED Final   Neisseria meningitidis NOT DETECTED NOT DETECTED Final   Pseudomonas aeruginosa NOT DETECTED NOT DETECTED Final   Candida albicans NOT DETECTED NOT DETECTED Final   Candida glabrata NOT DETECTED NOT DETECTED Final   Candida krusei NOT DETECTED NOT DETECTED Final   Candida parapsilosis NOT DETECTED NOT DETECTED Final   Candida tropicalis NOT DETECTED NOT DETECTED Final    Comment: Performed at [facility], [address] [place], [place] [address]  Urine culture     Status: Abnormal   Collection Time: 05/04/18  1:20 PM  Result Value Ref Range Status   Specimen Description URINE, RANDOM  Final   Special Requests NONE  Final   Culture (A)  Final    <10,000 COLONIES/mL INSIGNIFICANT GROWTH Performed at [facility], [address] [place], [place] [address]    Report Status 05/05/2018 FINAL  Final    Radiology Reports Ct Head Wo Contrast  Result Date: 05/04/2018 CLINICAL DATA:  Seizure with nausea and vomiting. Chronic altered mental status EXAM: CT HEAD WITHOUT CONTRAST TECHNIQUE: Contiguous axial images were obtained from the base of the skull through the vertex without intravenous contrast. COMPARISON:  June 10, 2010 FINDINGS: Brain: There is stable mild diffuse atrophy. There is relative enlargement of the atrium of the right lateral ventricle compared to its counterpart on the left, felt to be secondary to ex vacuo phenomenon. There is no appreciable intracranial mass, hemorrhage, extra-axial fluid collection, or midline shift. 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 There is evidence of a prior infarct in the head of the caudate nucleus on the right. There is evidence of a prior infarct involving a portion of the body of the caudate nucleus on the left. There is evidence of a prior infarct in the left frontal lobe adjacent to the anterior horn of the left lateral  ventricle. No acute appearing infarct is appreciable. Vascular: There is no appreciable hyperdense vessel. There is calcification in each carotid siphon region. Skull: The bony calvarium appears intact. Sinuses/Orbits: There is mucosal thickening in several ethmoid air cells. Other visualized paranasal sinuses are clear. Orbits appear symmetric bilaterally except for evident cataract removal on the right. Other: Mastoid air cells are clear. IMPRESSION: Atrophy with stable prior infarcts. No acute infarct evident. No mass or hemorrhage. Foci of arterial vascular calcification noted. There is mucosal thickening in several ethmoid air cells. Electronically Signed   By: Bretta Bang III M.D.   On: 05/04/2018 12:28   Ct Abdomen Pelvis W Contrast  Result Date: 05/04/2018 CLINICAL DATA:  Nausea and vomiting. EXAM: CT ABDOMEN AND PELVIS WITH CONTRAST TECHNIQUE: Multidetector CT imaging of the abdomen and pelvis was performed using the standard protocol following bolus administration of intravenous contrast. CONTRAST:  75mL OMNIPAQUE IOHEXOL 300 MG/ML  SOLN COMPARISON:  None. FINDINGS: Lower chest: No acute abnormality. Hepatobiliary: There are 2 small cysts in the dome of the right lobe of the liver. Slight periportal edema, nonspecific. No focal liver lesions. Biliary tree is normal. Pancreas: Normal. Spleen: Normal. Adrenals/Urinary Tract: There is bilateral moderate hydronephrosis. The ureters are dilated to the level of the distended bladder. Bladder wall is slightly thickened. There is slight irregular and nodularity of the median lobe of the prostate  gland. The patient has dilatation of the penile urethra. No visible catheter in place. Possibility of a urethral stenosis or a distal penile mass should be considered. Stomach/Bowel: No dilated loops of large or small bowel. Detail is suboptimal due to lack of contrast and lack of body fat. The appendix is visible and appears normal. Vascular/Lymphatic: Slight aortic atherosclerosis. No discrete adenopathy. Reproductive: Enlargement and irregularity and nodularity of the median lobe of the prostate gland. Seminal vesicles are normal. Other: No free air or free fluid in the abdomen. No discrete hernias. Musculoskeletal: No acute bone abnormality. IMPRESSION: 1. Bilateral hydronephrosis with distention of the bladder and dilatation of the penile urethra. I suspect there is either a distal penile mass or distal urethral stenosis. 2.  Aortic Atherosclerosis (ICD10-I70.0). 3. Irregular nodularity and enlargement of the median lobe of the prostate gland. 4. Nonspecific hepatic periportal edema. Electronically Signed   By: Francene Boyers M.D.   On: 05/04/2018 12:54   Dg Chest Port 1 View  Result Date: 05/05/2018 CLINICAL DATA:  Shortness of breath EXAM: PORTABLE CHEST 1 VIEW COMPARISON:  Portable exam 2347 hours compared to 05/04/2018 FINDINGS: Upper normal size of cardiac silhouette. Mediastinal contours and pulmonary vascularity normal. BILATERAL pulmonary infiltrates favor multifocal pneumonia. No pleural effusion or pneumothorax. Bones demineralized. IMPRESSION: BILATERAL pulmonary infiltrates favoring multifocal pneumonia. Electronically Signed   By: Ulyses Southward M.D.   On: 05/05/2018 23:58   Dg Chest Port 1 View  Result Date: 05/04/2018 CLINICAL DATA:  Fever and vomiting EXAM: PORTABLE CHEST 1 VIEW COMPARISON:  06/10/2010 FINDINGS: Cardiac shadows within normal limits. Mild aortic calcifications are seen. The lungs are well aerated bilaterally. No focal infiltrate or sizable effusion is seen. No acute bony  abnormality is noted. IMPRESSION: No active disease. Electronically Signed   By: Alcide Clever M.D.   On: 05/04/2018 08:23    Time Spent in minutes  30   Susa Raring M.D on 05/06/2018 at 9:50 AM  To page go to [email or web address] - password [facility]

## 2018-05-06 NOTE — Progress Notes (Addendum)
Pharmacy Antibiotic Note  [name] is a 78 y.o. male admitted on 05/04/2018 with sepsis with concern for GNR UTI, now with possible aspiration pneumonia.  Pharmacy has been consulted for Unasyn dosing.  Plan: Unasyn 3 g IV q12h Follow cultures, LOT, and clinical improvement  Height: 5\' 7"  (170.2 cm) Weight: 119 lb 0.8 oz (54 kg) IBW/kg (Calculated) : 66.1  Temp (24hrs), Avg:99.3 F (37.4 C), Min:98.3 F (36.8 C), Max:100.4 F (38 C)  Recent Labs  Lab 05/04/18 0815 05/04/18 1026 05/04/18 1809 05/05/18 0456 05/06/18 0434  WBC 1.7*  --   --  21.2* 25.4*  CREATININE 1.97*  --   --  1.87* 1.76*  LATICACIDVEN 7.7* 6.3* 4.2*  --   --     Estimated Creatinine Clearance: 26.4 mL/min (A) (by C-G formula based on SCr of 1.76 mg/dL (H)).    No Known Allergies  Antimicrobials this admission: Vancomycin 2/7 >> 2/8 Cefepime 2/7 >> 2/8 Metronidazole 2/7>> 2/8 Ceftriaxone 2/8 >> 2/9 Unasyn 2/9 >>  Microbiology results: 2/7 BCx: strep species - likely contaminant; GPC chains 2/7 urine: ngF  Thank you for allowing pharmacy to be a part of this patient's care.  Danae Orleans, PharmD PGY1 Pharmacy Resident Phone [phone] 05/06/2018       9:57 AM

## 2018-05-06 NOTE — Evaluation (Signed)
Clinical/Bedside Swallow Evaluation Patient Details  Name: [name] MRN: [id number] Date of Birth: 08/26/1940  Today's Date: 05/06/2018 Time: SLP Start Time (ACUTE ONLY): 1643 SLP Stop Time (ACUTE ONLY): 1700 SLP Time Calculation (min) (ACUTE ONLY): 17 min  Past Medical History:  Past Medical History:  Diagnosis Date  . Blindness   . Mentally disabled    Past Surgical History: History reviewed. No pertinent surgical history. HPI:  Pt is a 78 y.o. male with medical history significant of ID with developmental delay. He was up this morning and was given breakfast but was subsequently noted to be having what look like a seizure-like activity. Patient has a seizure disorder and takes Keppra but he has not had a seizure since he moved to [place] 3 years ago. He felt hot to the touch and concern was raised for a fever. EMS was activated and transported the patient into the emergency department. Patient had two  episodes of vomiting in the ED. Chest x-ray revealed bilateral pulmonary infiltrates favoring multifocal pneumonia. 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   Assessment / Plan / Recommendation Clinical Impression  Pt was seen for bedside swallow evaluation with his sister-in-law present. The pt's verbal output was minimal and he exhibited difficulty following all the necessary commands for a complete oral mechanism exam. He tolerated puree solids, mechanical soft solids, and consecutive swallows of thin liquids via straw without overt s/sx of aspiration. A tongue thrust swallow pattern was observed with all consistencies and mastication time was moderately increased with mechanical soft solids. Pt's sister-in-law indicated that the pt does take longer to complete meals due to his edentulous status but admitted that his mastication time has been notably increased since he was admitted and he has been spitting out solid  foods (e.g., bacon) which he previously enjoyed. Pt grimaced during deglutition of all consistencies but the pt's family reported that this is his baseline. Considering the pt's respiratory status combined with his prolonged mastication, it is recommended that his diet be downgraded to dysphagia 1 (puree) with thin liquids. SLP will follow to assess tolerance of the recommended diet and his ability to tolerate more advanced diet consistencies.  SLP Visit Diagnosis: Dysphagia, oral phase (R13.11)    Aspiration Risk  Mild aspiration risk    Diet Recommendation Dysphagia 1 (Puree);Thin liquid   Liquid Administration via: Cup;Straw Medication Administration: Crushed with puree Supervision: Staff to assist with self feeding Compensations: Slow rate;Small sips/bites Postural Changes: Seated upright at 90 degrees;Remain upright for at least 30 minutes after po intake    Other  Recommendations Oral Care Recommendations: Oral care BID;Staff/trained caregiver to provide oral care   Follow up Recommendations        Frequency and Duration min 2x/week  2 weeks       Prognosis Prognosis for Safe Diet Advancement: Good Barriers to Reach Goals: Cognitive deficits      Swallow Study   General Date of Onset: 05/04/18 HPI: Pt is a 78 y.o. male with medical history significant of ID with developmental delay. He was up this morning and was given breakfast but was subsequently noted to be having what look like a seizure-like activity. Patient has a seizure disorder and takes Keppra but he has not had a seizure since he moved to [place] 3 years ago. He felt hot to the touch and concern was raised for a fever. EMS was activated and transported the  patient into the emergency department. Patient had two  episodes of vomiting in the ED. Chest x-ray revealed bilateral pulmonary infiltrates favoring multifocal pneumonia. CT of the head was negative for acute infarct but revealed chronic infarct in the  posterior right temporal lobe. Type of Study: Bedside Swallow Evaluation Previous Swallow Assessment: None Diet Prior to this Study: Dysphagia 3 (soft);Thin liquids Temperature Spikes Noted: No Respiratory Status: Nasal cannula History of Recent Intubation: No Behavior/Cognition: Alert;Cooperative;Pleasant mood;Other (Comment)(Difficulty following commands) Oral Cavity Assessment: Within Functional Limits Oral Care Completed by SLP: No Oral Cavity - Dentition: Edentulous Vision: Impaired for self-feeding Self-Feeding Abilities: Needs assist Patient Positioning: Upright in bed;Postural control adequate for testing Baseline Vocal Quality: Breathy;Low vocal intensity Volitional Cough: Cognitively unable to elicit Volitional Swallow: Able to elicit    Oral/Motor/Sensory Function Overall Oral Motor/Sensory Function: (WFL based on limited assessment)   Ice Chips Ice chips: Within functional limits Presentation: Spoon   Thin Liquid Thin Liquid: Within functional limits Presentation: Straw    Nectar Thick Nectar Thick Liquid: Not tested   Honey Thick Honey Thick Liquid: Not tested   Puree Puree: Within functional limits Presentation: Spoon   Solid     Solid: Impaired Presentation: Spoon Oral Phase Impairments: Impaired mastication     Ivee Poellnitz I. Vear Clock, MS, CCC-SLP Acute Rehabilitation Services Office number [phone] Pager [phone]  Scheryl Marten 05/06/2018,5:36 PM

## 2018-05-06 NOTE — Progress Notes (Signed)
Placed patient on HFNC at this time. Tolerating well.

## 2018-05-07 ENCOUNTER — Inpatient Hospital Stay (HOSPITAL_COMMUNITY): Payer: Medicare (Managed Care)

## 2018-05-07 LAB — BASIC METABOLIC PANEL
Anion gap: 8 (ref 5–15)
BUN: 26 mg/dL — ABNORMAL HIGH (ref 8–23)
CO2: 23 mmol/L (ref 22–32)
Calcium: 8.1 mg/dL — ABNORMAL LOW (ref 8.9–10.3)
Chloride: 115 mmol/L — ABNORMAL HIGH (ref 98–111)
Creatinine, Ser: 1.57 mg/dL — ABNORMAL HIGH (ref 0.61–1.24)
GFR calc Af Amer: 48 mL/min — ABNORMAL LOW (ref >60)
GFR calc non Af Amer: 42 mL/min — ABNORMAL LOW (ref >60)
Glucose, Bld: 137 mg/dL — ABNORMAL HIGH (ref 70–99)
Potassium: 3.2 mmol/L — ABNORMAL LOW (ref 3.5–5.1)
Sodium: 146 mmol/L — ABNORMAL HIGH (ref 135–145)

## 2018-05-07 LAB — CBC
HCT: 36.6 % — ABNORMAL LOW (ref 39.0–52.0)
Hemoglobin: 12.1 g/dL — ABNORMAL LOW (ref 13.0–17.0)
MCH: 31.8 pg (ref 26.0–34.0)
MCHC: 33.1 g/dL (ref 30.0–36.0)
MCV: 96.1 fL (ref 80.0–100.0)
Platelets: 67 10*3/uL — ABNORMAL LOW (ref 150–400)
RBC: 3.81 MIL/uL — ABNORMAL LOW (ref 4.22–5.81)
RDW: 13.5 % (ref 11.5–15.5)
WBC: 17 10*3/uL — ABNORMAL HIGH (ref 4.0–10.5)
nRBC: 0 % (ref 0.0–0.2)

## 2018-05-07 LAB — CULTURE, BLOOD (ROUTINE X 2): Special Requests: ADEQUATE

## 2018-05-07 LAB — GLUCOSE, CAPILLARY: Glucose-Capillary: 112 mg/dL — ABNORMAL HIGH (ref 70–99)

## 2018-05-07 LAB — PROCALCITONIN: Procalcitonin: 33.68 ng/mL

## 2018-05-07 MED ORDER — POTASSIUM CHLORIDE 20 MEQ PO PACK: 20.0000 meq | PACK | Freq: Two times a day (BID) | ORAL | Status: DC

## 2018-05-07 MED ORDER — POTASSIUM CHLORIDE 2 MEQ/ML IV SOLN
INTRAVENOUS | Status: AC
  Administered 2018-05-07 (×2): via INTRAVENOUS
  Filled 2018-05-07 (×3): qty 1000

## 2018-05-07 MED ORDER — POTASSIUM CHLORIDE 20 MEQ PO PACK
20.0000 meq | PACK | Freq: Once | ORAL | Status: AC
  Administered 2018-05-07: 20 meq via ORAL
  Filled 2018-05-07: qty 1

## 2018-05-07 MED ORDER — POTASSIUM CHLORIDE CRYS ER 20 MEQ PO TBCR: 20.0000 meq | EXTENDED_RELEASE_TABLET | Freq: Once | ORAL | Status: DC

## 2018-05-07 MED ORDER — POTASSIUM CL IN DEXTROSE 5% 40 MEQ/L IV SOLN: INTRAVENOUS | Status: DC

## 2018-05-07 MED ORDER — MAGNESIUM SULFATE IN D5W 1-5 GM/100ML-% IV SOLN
1.0000 g | Freq: Once | INTRAVENOUS | Status: AC
  Administered 2018-05-07: 1 g via INTRAVENOUS
  Filled 2018-05-07: qty 100

## 2018-05-07 NOTE — Progress Notes (Signed)
PT Cancellation Note  Patient Details Name: [name] MRN: [id number] DOB: 08-24-1940   Cancelled Treatment:    Reason Eval/Treat Not Completed: Patient declined, no reason specified- patient singing throughout attempt to mobilize. States "no, no" on occasion. Will return tomorrow if appropriate for evaluation.     [name] 05/07/2018, 3:28 PM

## 2018-05-07 NOTE — Progress Notes (Addendum)
  Speech Language Pathology Treatment: Dysphagia  Patient Details Name: [name] MRN: [id number] DOB: 08/29/1940 Today's Date: 05/07/2018 Time: 9702-6378 SLP Time Calculation (min) (ACUTE ONLY): 15 min  Assessment / Plan / Recommendation Clinical Impression  SLP in for follow up after BSE completed 05/06/2018. No family present at this time. Pt accepted minimal trials of puree and thin liquid. No overt s/s aspiration observed with either consistency, however, pt declined further presentations after 2-3 boluses of each consistency. Note repeat CXR has been ordered. ST will continue to follow to assess diet tolerance and readiness to advance. Pt has minimal risk indicators per PMH (only mental disability), however, instrumental assessment may be beneficial to objectively assess swallow function and safety if lungs do not clear in a timely fashion.    HPI HPI: Pt is a 78 y.o. male with medical history significant of ID with developmental delay. He was up this morning and was given breakfast but was subsequently noted to be having what look like a seizure-like activity. Patient has a seizure disorder and takes Keppra but he has not had a seizure since he moved to [place] 3 years ago. He felt hot to the touch and concern was raised for a fever. EMS was activated and transported the patient into the emergency department. Patient had two  episodes of vomiting in the ED. Chest x-ray revealed bilateral pulmonary infiltrates favoring multifocal pneumonia. 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      SLP Plan  Continue with current plan of care       Recommendations  Diet recommendations: Dysphagia 1 (puree);Thin liquid Liquids provided via: Straw Medication Administration: Crushed with puree Supervision: Full supervision/cueing for compensatory strategies;Staff to assist with self feeding Compensations: Minimize  environmental distractions;Slow rate;Small sips/bites Postural Changes and/or Swallow Maneuvers: Seated upright 90 degrees;Upright 30-60 min after meal                Oral Care Recommendations: Oral care BID;Staff/trained caregiver to provide oral care Follow up Recommendations: 24 hour supervision/assistance SLP Visit Diagnosis: Dysphagia, oral phase (R13.11) Plan: Continue with current plan of care       GO            B. Murvin Natal [facility], CCC-SLP Speech Language Pathologist [phone]  Leigh Aurora 05/07/2018, 12:24 PM

## 2018-05-07 NOTE — Plan of Care (Signed)

## 2018-05-07 NOTE — Progress Notes (Signed)
PROGRESS NOTE                                                                                                                                                                                                             Patient Demographics:    [name], is a 78 y.o. male, DOB - 1940-07-10, WUJ:[id number]  Admit date - 05/04/2018   Admitting Physician Lahoma Crocker, MD  Outpatient Primary MD for the patient is Knox Royalty, MD  LOS - 3  Chief Complaint  Patient presents with  . Seizures       Brief Narrative  [name] is a 78 y.o. 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  He was up this morning and was given breakfast.  He then went back to his room and was checked on by his family member and he was noted to be moving what look like a seizure-like activity.  Patient does have a seizure disorder and takes Keppra but he has not had a seizure since he moved to [place] 3 years ago.  He felt hot to the touch and concern was raised for a fever.    In the ER he was diagnosed with a UTI/pyelonephritis due to bladder outlet obstruction, he received a Foley catheter urology was consulted and he was admitted to the hospital.   Subjective:   Patient in bed, appears comfortable, mild cough.    Assessment  & Plan :     1.  Sepsis due to UTI/pyelonephritis caused by bladder outlet obstruction.  He is now appearing to be bacteremic with both E. coli and Streptococcus, on 2 g Rocephin, does have a Foley and Flomax, sepsis pathophysiology resolved, improving procalcitonin levels.  Clinically appears nontoxic.  Seen by urology Dr.Herrick who wants outpatient follow-up in 2 weeks.  Be discharged with Foley and Flomax.  2.  ARF.  Again due to bladder outlet obstruction. Foley to decompress, hydrate and monitor.  Avoid nephrotoxins.  Stopped Toradol  and ibuprofen.  Renal function improving.  3. Thrombocytopenia.  Could be due to sepsis.  On heparin for prophylaxis, closely monitor CBC closely.  Lab Results  Component Value Date   PLT 67 (L) 05/07/2018     4.  Severe underlying mental retardation.  Supportive care.  5.  H/O seizures.  On IV Keppra.  6.  Cough and  hypoxia developing 05/06/2018.  Likely multifocal pneumonia versus aspiration pneumonia -speech therapy following, on dysphagia 1 diet, continue feeding assistance aspiration precautions, on Rocephin and Flagyl combination which will be continued, repeat two-view chest x-ray.    Family Communication  :  daughter  Code Status :  DNR  Disposition Plan  :  TBD  Consults  :  Urology  Procedures  :    CT head and CXR - non acute  CT ABD- 1. Bilateral hydronephrosis with distention of the bladder and dilatation of the penile urethra. I suspect there is either a distal penile mass or distal urethral stenosis. 2.  Aortic Atherosclerosis (ICD10-I70.0). 3. Irregular nodularity and enlargement of the median lobe of the prostate gland. 4. Nonspecific hepatic periportal edema.   DVT Prophylaxis  :    Heparin    Lab Results  Component Value Date   PLT 67 (L) 05/07/2018    Diet :  Diet Order            DIET - DYS 1 Room service appropriate? Yes; Fluid consistency: Thin  Diet effective now               Inpatient Medications Scheduled Meds: . dorzolamide  1 drop Both Eyes Daily  . feeding supplement (ENSURE ENLIVE)  237 mL Oral TID BM  . heparin  5,000 Units Subcutaneous Q8H  . latanoprost  1 drop Both Eyes QHS  . potassium chloride  20 mEq Oral Once  . tamsulosin  0.4 mg Oral QPC supper   Continuous Infusions: . cefTRIAXone (ROCEPHIN)  IV 2 g (05/07/18 0858)  . dextrose 5 % with kcl 100 mL/hr at 05/07/18 0828  . levETIRAcetam 250 mg (05/07/18 0834)  . metronidazole 500 mg (05/07/18 0340)   PRN Meds:.acetaminophen **OR** [DISCONTINUED] acetaminophen,  ipratropium-albuterol, polyethylene glycol  Antibiotics  :   Anti-infectives (From admission, onward)   Start     Dose/Rate Route Frequency Ordered Stop   05/07/18 1000  cefTRIAXone (ROCEPHIN) 2 g in sodium chloride 0.9 % 100 mL IVPB     2 g 200 mL/hr over 30 Minutes Intravenous Every 24 hours 05/06/18 1010     05/06/18 1100  Ampicillin-Sulbactam (UNASYN) 3 g in sodium chloride 0.9 % 100 mL IVPB  Status:  Discontinued     3 g 200 mL/hr over 30 Minutes Intravenous Every 12 hours 05/06/18 0959 05/06/18 1010   05/06/18 1100  metroNIDAZOLE (FLAGYL) IVPB 500 mg     500 mg 100 mL/hr over 60 Minutes Intravenous Every 8 hours 05/06/18 1010     05/06/18 1000  vancomycin (VANCOCIN) 500 mg in sodium chloride 0.9 % 100 mL IVPB  Status:  Discontinued     500 mg 100 mL/hr over 60 Minutes Intravenous Every 48 hours 05/04/18 0940 05/04/18 1521   05/06/18 1000  cefTRIAXone (ROCEPHIN) 1 g in sodium chloride 0.9 % 100 mL IVPB  Status:  Discontinued     1 g 200 mL/hr over 30 Minutes Intravenous Every 24 hours 05/06/18 0952 05/06/18 1010   05/05/18 1000  ceFEPIme (MAXIPIME) 1 g in sodium chloride 0.9 % 100 mL IVPB  Status:  Discontinued     1 g 200 mL/hr over 30 Minutes Intravenous Every 24 hours 05/04/18 0940 05/04/18 1521   05/05/18 0900  cefTRIAXone (ROCEPHIN) 1 g in sodium chloride 0.9 % 100 mL IVPB  Status:  Discontinued     1 g 200 mL/hr over 30 Minutes Intravenous Every 24 hours 05/04/18 1520 05/05/18 0316  05/05/18 0900  cefTRIAXone (ROCEPHIN) 2 g in sodium chloride 0.9 % 100 mL IVPB  Status:  Discontinued     2 g 200 mL/hr over 30 Minutes Intravenous Every 24 hours 05/05/18 0316 05/06/18 0949   05/04/18 0915  ceFEPIme (MAXIPIME) 2 g in sodium chloride 0.9 % 100 mL IVPB     2 g 200 mL/hr over 30 Minutes Intravenous  Once 05/04/18 0909 05/04/18 1008   05/04/18 0915  metroNIDAZOLE (FLAGYL) IVPB 500 mg  Status:  Discontinued     500 mg 100 mL/hr over 60 Minutes Intravenous Every 8 hours 05/04/18  0909 05/04/18 1521   05/04/18 0915  vancomycin (VANCOCIN) IVPB 1000 mg/200 mL premix     1,000 mg 200 mL/hr over 60 Minutes Intravenous  Once 05/04/18 0909 05/04/18 1059        Objective:   Vitals:   05/06/18 2203 05/07/18 0500 05/07/18 0516 05/07/18 0529  BP: (!) 123/97   (!) 136/101  Pulse: (!) 104   92  Resp: 17   15  Temp: 99.4 F (37.4 C)   97.7 F (36.5 C)  TempSrc:      SpO2: 99%  98% 98%  Weight:  54.4 kg    Height:        Wt Readings from Last 3 Encounters:  05/07/18 54.4 kg     Intake/Output Summary (Last 24 hours) at 05/07/2018 1034 Last data filed at 05/07/2018 0221 Gross per 24 hour  Intake 1494.33 ml  Output 425 ml  Net 1069.33 ml     Physical Exam  Awake - baseline agitated (MR), Foley in place  [place].AT,PERRAL Supple Neck,No JVD, No cervical lymphadenopathy appriciated.  Symmetrical Chest wall movement, Good air movement bilaterally, CTAB RRR,No Gallops, Rubs or new Murmurs, No Parasternal Heave +ve B.Sounds, Abd Soft, No tenderness, No organomegaly appriciated, No rebound - guarding or rigidity. No Cyanosis, Clubbing or edema, No new Rash or bruise       Data Review:    CBC Recent Labs  Lab 05/04/18 0815 05/05/18 0456 05/06/18 0434 05/07/18 0405  WBC 1.7* 21.2* 25.4* 17.0*  HGB 14.3 11.6* 14.3 12.1*  HCT 44.6 35.1* 41.7 36.6*  PLT 95* 67* 74* 67*  MCV 100.0 98.3 95.4 96.1  MCH 32.1 32.5 32.7 31.8  MCHC 32.1 33.0 34.3 33.1  RDW 12.9 13.3 13.5 13.5  LYMPHSABS 0.3*  --   --   --   MONOABS 0.0*  --   --   --   EOSABS 0.0  --   --   --   BASOSABS 0.0  --   --   --     Chemistries  Recent Labs  Lab 05/04/18 0815 05/05/18 0456 05/06/18 0434 05/07/18 0405  NA 144 146* 147* 146*  K 3.4* 3.5 3.5 3.2*  CL 105 112* 114* 115*  CO2 20* 21* 19* 23  GLUCOSE 95 104* 87 137*  BUN 18 22 23  26*  CREATININE 1.97* 1.87* 1.76* 1.57*  CALCIUM 9.4 8.4* 8.5* 8.1*  MG  --   --  1.7  --   AST 36  --   --   --   ALT 16  --   --   --   ALKPHOS  88  --   --   --   BILITOT 1.9*  --   --   --    ------------------------------------------------------------------------------------------------------------------ No results for input(s): CHOL, HDL, LDLCALC, TRIG, CHOLHDL, LDLDIRECT in the last 72 hours.  Lab Results  Component  Value Date   HGBA1C 5.8 (H) 05/04/2018   ------------------------------------------------------------------------------------------------------------------ Recent Labs    05/04/18 1809  TSH 0.713   ------------------------------------------------------------------------------------------------------------------ No results for input(s): VITAMINB12, FOLATE, FERRITIN, TIBC, IRON, RETICCTPCT in the last 72 hours.  Coagulation profile Recent Labs  Lab 05/05/18 0456  INR 1.56    No results for input(s): DDIMER in the last 72 hours.  Cardiac Enzymes No results for input(s): CKMB, TROPONINI, MYOGLOBIN in the last 168 hours.  Invalid input(s): CK ------------------------------------------------------------------------------------------------------------------ No results found for: BNP  Micro Results Recent Results (from the past 240 hour(s))  Culture, blood (routine x 2)     Status: None (Preliminary result)   Collection Time: 05/04/18  8:43 AM  Result Value Ref Range Status   Specimen Description BLOOD SITE NOT SPECIFIED  Final   Special Requests   Final    BOTTLES DRAWN AEROBIC AND ANAEROBIC Blood Culture adequate volume   Culture   Final    NO GROWTH 2 DAYS Performed at [facility], [address] [place], [place] [address]    Report Status PENDING  Incomplete  Culture, blood (routine x 2)     Status: Abnormal   Collection Time: 05/04/18  8:53 AM  Result Value Ref Range Status   Specimen Description BLOOD RIGHT FOREARM  Final   Special Requests BLOOD Blood Culture adequate volume  Final   Culture  Setup Time   Final    GRAM POSITIVE COCCI IN CHAINS IN BOTH AEROBIC AND ANAEROBIC  BOTTLES CRITICAL RESULT CALLED TO, READ BACK BY AND VERIFIED WITH: K.AMEND,PHARMD AT [id number] ON 05/05/18 BY G.MCADOO    Culture (A)  Final    VIRIDANS STREPTOCOCCUS ESCHERICHIA COLI CRITICAL RESULT CALLED TO, READ BACK BY AND VERIFIED WITHColin Rhein: B. MANCHERIL PHARMD, AT [id number] 05/06/18 BY Renato Shin. VANHOOK  Performed at [facility], [address] [place], [place] [address]    Report Status 05/07/2018 FINAL  Final   Organism ID, Bacteria ESCHERICHIA COLI  Final      Susceptibility   Escherichia coli - MIC*    AMPICILLIN >=32 RESISTANT Resistant     CEFAZOLIN <=4 SENSITIVE Sensitive     CEFEPIME <=1 SENSITIVE Sensitive     CEFTAZIDIME <=1 SENSITIVE Sensitive     CEFTRIAXONE <=1 SENSITIVE Sensitive     CIPROFLOXACIN >=4 RESISTANT Resistant     GENTAMICIN <=1 SENSITIVE Sensitive     IMIPENEM <=0.25 SENSITIVE Sensitive     TRIMETH/SULFA <=20 SENSITIVE Sensitive     AMPICILLIN/SULBACTAM 16 INTERMEDIATE Intermediate     PIP/TAZO <=4 SENSITIVE Sensitive     Extended ESBL NEGATIVE Sensitive     * ESCHERICHIA COLI  Blood Culture ID Panel (Reflexed)     Status: Abnormal   Collection Time: 05/04/18  8:53 AM  Result Value Ref Range Status   Enterococcus species NOT DETECTED NOT DETECTED Final   Listeria monocytogenes NOT DETECTED NOT DETECTED Final   Staphylococcus species NOT DETECTED NOT DETECTED Final   Staphylococcus aureus (BCID) NOT DETECTED NOT DETECTED Final   Streptococcus species DETECTED (A) NOT DETECTED Final    Comment: Not Enterococcus species, Streptococcus agalactiae, Streptococcus pyogenes, or Streptococcus pneumoniae. CRITICAL RESULT CALLED TO, READ BACK BY AND VERIFIED WITH: K.AMEND,PHARMD AT 0307 ON 05/05/18 BY G.MCADOO    Streptococcus agalactiae NOT DETECTED NOT DETECTED Final   Streptococcus pneumoniae NOT DETECTED NOT DETECTED Final   Streptococcus pyogenes NOT DETECTED NOT DETECTED Final   Acinetobacter baumannii NOT DETECTED NOT DETECTED Final   Enterobacteriaceae species  NOT  DETECTED NOT DETECTED Final   Enterobacter cloacae complex NOT DETECTED NOT DETECTED Final   Escherichia coli NOT DETECTED NOT DETECTED Final   Klebsiella oxytoca NOT DETECTED NOT DETECTED Final   Klebsiella pneumoniae NOT DETECTED NOT DETECTED Final   Proteus species NOT DETECTED NOT DETECTED Final   Serratia marcescens NOT DETECTED NOT DETECTED Final   Haemophilus influenzae NOT DETECTED NOT DETECTED Final   Neisseria meningitidis NOT DETECTED NOT DETECTED Final   Pseudomonas aeruginosa NOT DETECTED NOT DETECTED Final   Candida albicans NOT DETECTED NOT DETECTED Final   Candida glabrata NOT DETECTED NOT DETECTED Final   Candida krusei NOT DETECTED NOT DETECTED Final   Candida parapsilosis NOT DETECTED NOT DETECTED Final   Candida tropicalis NOT DETECTED NOT DETECTED Final    Comment: Performed at [facility], [address], [place], [place] [address]  Urine culture     Status: Abnormal   Collection Time: 05/04/18  1:20 PM  Result Value Ref Range Status   Specimen Description URINE, RANDOM  Final   Special Requests NONE  Final   Culture (A)  Final    <10,000 COLONIES/mL INSIGNIFICANT GROWTH Performed at [facility], [address], [place], [place] [address]    Report Status 05/05/2018 FINAL  Final    Radiology Reports Ct Head Wo Contrast  Result Date: 05/04/2018 CLINICAL DATA:  Seizure with nausea and vomiting. Chronic altered mental status EXAM: CT HEAD WITHOUT CONTRAST TECHNIQUE: Contiguous axial images were obtained from the base of the skull through the vertex without intravenous contrast. COMPARISON:  June 10, 2010 FINDINGS: Brain: There is stable mild diffuse atrophy. There is relative enlargement of the atrium of the right lateral ventricle compared to its counterpart on the left, felt to be secondary to ex vacuo phenomenon. There is no appreciable intracranial mass, hemorrhage, extra-axial fluid collection, or midline shift. There is evidence of prior infarct  in the posterior right temporal lobe, stable. There is evidence of a prior infarct in the head of the caudate nucleus on the right. There is evidence of a prior infarct involving a portion of the body of the caudate nucleus on the left. There is evidence of a prior infarct in the left frontal lobe adjacent to the anterior horn of the left lateral ventricle. No acute appearing infarct is appreciable. Vascular: There is no appreciable hyperdense vessel. There is calcification in each carotid siphon region. Skull: The bony calvarium appears intact. Sinuses/Orbits: There is mucosal thickening in several ethmoid air cells. Other visualized paranasal sinuses are clear. Orbits appear symmetric bilaterally except for evident cataract removal on the right. Other: Mastoid air cells are clear. IMPRESSION: Atrophy with stable prior infarcts. No acute infarct evident. No mass or hemorrhage. Foci of arterial vascular calcification noted. There is mucosal thickening in several ethmoid air cells. Electronically Signed   By: Bretta Bang III M.D.   On: 05/04/2018 12:28   Ct Abdomen Pelvis W Contrast  Result Date: 05/04/2018 CLINICAL DATA:  Nausea and vomiting. EXAM: CT ABDOMEN AND PELVIS WITH CONTRAST TECHNIQUE: Multidetector CT imaging of the abdomen and pelvis was performed using the standard protocol following bolus administration of intravenous contrast. CONTRAST:  45mL OMNIPAQUE IOHEXOL 300 MG/ML  SOLN COMPARISON:  None. FINDINGS: Lower chest: No acute abnormality. Hepatobiliary: There are 2 small cysts in the dome of the right lobe of the liver. Slight periportal edema, nonspecific. No focal liver lesions. Biliary tree is normal. Pancreas: Normal. Spleen: Normal. Adrenals/Urinary Tract: There  is bilateral moderate hydronephrosis. The ureters are dilated to the level of the distended bladder. Bladder wall is slightly thickened. There is slight irregular and nodularity of the median lobe of the prostate gland. The  patient has dilatation of the penile urethra. No visible catheter in place. Possibility of a urethral stenosis or a distal penile mass should be considered. Stomach/Bowel: No dilated loops of large or small bowel. Detail is suboptimal due to lack of contrast and lack of body fat. The appendix is visible and appears normal. Vascular/Lymphatic: Slight aortic atherosclerosis. No discrete adenopathy. Reproductive: Enlargement and irregularity and nodularity of the median lobe of the prostate gland. Seminal vesicles are normal. Other: No free air or free fluid in the abdomen. No discrete hernias. Musculoskeletal: No acute bone abnormality. IMPRESSION: 1. Bilateral hydronephrosis with distention of the bladder and dilatation of the penile urethra. I suspect there is either a distal penile mass or distal urethral stenosis. 2.  Aortic Atherosclerosis (ICD10-I70.0). 3. Irregular nodularity and enlargement of the median lobe of the prostate gland. 4. Nonspecific hepatic periportal edema. Electronically Signed   By: Francene Boyers M.D.   On: 05/04/2018 12:54   Dg Chest Port 1 View  Result Date: 05/05/2018 CLINICAL DATA:  Shortness of breath EXAM: PORTABLE CHEST 1 VIEW COMPARISON:  Portable exam 2347 hours compared to 05/04/2018 FINDINGS: Upper normal size of cardiac silhouette. Mediastinal contours and pulmonary vascularity normal. BILATERAL pulmonary infiltrates favor multifocal pneumonia. No pleural effusion or pneumothorax. Bones demineralized. IMPRESSION: BILATERAL pulmonary infiltrates favoring multifocal pneumonia. Electronically Signed   By: Ulyses Southward M.D.   On: 05/05/2018 23:58   Dg Chest Port 1 View  Result Date: 05/04/2018 CLINICAL DATA:  Fever and vomiting EXAM: PORTABLE CHEST 1 VIEW COMPARISON:  06/10/2010 FINDINGS: Cardiac shadows within normal limits. Mild aortic calcifications are seen. The lungs are well aerated bilaterally. No focal infiltrate or sizable effusion is seen. No acute bony abnormality is  noted. IMPRESSION: No active disease. Electronically Signed   By: Alcide Clever M.D.   On: 05/04/2018 08:23    Time Spent in minutes  30   Susa Raring M.D on 05/07/2018 at 10:34 AM  To page go to [email or web address] - password [email or web address]

## 2018-05-08 LAB — BASIC METABOLIC PANEL
ANION GAP: 6 (ref 5–15)
BUN: 19 mg/dL (ref 8–23)
CALCIUM: 8 mg/dL — AB (ref 8.9–10.3)
CO2: 23 mmol/L (ref 22–32)
Chloride: 114 mmol/L — ABNORMAL HIGH (ref 98–111)
Creatinine, Ser: 1.22 mg/dL (ref 0.61–1.24)
GFR calc Af Amer: >60 mL/min (ref >60)
GFR calc non Af Amer: 56 mL/min — ABNORMAL LOW (ref >60)
Glucose, Bld: 120 mg/dL — ABNORMAL HIGH (ref 70–99)
Potassium: 3.7 mmol/L (ref 3.5–5.1)
Sodium: 143 mmol/L (ref 135–145)

## 2018-05-08 LAB — CBC
HCT: 33.7 % — ABNORMAL LOW (ref 39.0–52.0)
Hemoglobin: 11.1 g/dL — ABNORMAL LOW (ref 13.0–17.0)
MCH: 32 pg (ref 26.0–34.0)
MCHC: 32.9 g/dL (ref 30.0–36.0)
MCV: 97.1 fL (ref 80.0–100.0)
NRBC: 0 % (ref 0.0–0.2)
PLATELETS: 62 10*3/uL — AB (ref 150–400)
RBC: 3.47 MIL/uL — ABNORMAL LOW (ref 4.22–5.81)
RDW: 13.4 % (ref 11.5–15.5)
WBC: 11.3 10*3/uL — ABNORMAL HIGH (ref 4.0–10.5)

## 2018-05-08 LAB — MAGNESIUM: Magnesium: 1.9 mg/dL (ref 1.7–2.4)

## 2018-05-08 LAB — GLUCOSE, CAPILLARY: GLUCOSE-CAPILLARY: 117 mg/dL — AB (ref 70–99)

## 2018-05-08 LAB — PROCALCITONIN: Procalcitonin: 17.83 ng/mL

## 2018-05-08 MED ORDER — POTASSIUM CHLORIDE 2 MEQ/ML IV SOLN
INTRAVENOUS | Status: AC
  Administered 2018-05-08: 15:00:00 via INTRAVENOUS
  Filled 2018-05-08 (×2): qty 1000

## 2018-05-08 NOTE — Evaluation (Signed)
Physical Therapy Evaluation Patient Details Name: [name] MRN: [id number] DOB: [birth year] Today's Date: 05/08/2018   History of Present Illness  Patient is a 78 year old male admitted from home with seizure like activity. Found to have acute kidney injury and sepsis due to UTI. PMH: MR, developmental delay, seizures, blind.    Clinical Impression  Patient presents with decreased mobility and unable to motivate for EOB or OOB activity today despite total assist to initiate EOB, pt combative and not participating.  Per staff pt up in chair yesterday with family here.  Feel pt may be able to return home with family support depending on level of care needed previously compared to current function.  Will follow along for mobility and disposition.     Follow Up Recommendations SNF;No PT follow up(depending on PLOF/famly support)    Equipment Recommendations  Other (comment)(TBA)    Recommendations for Other Services       Precautions / Restrictions Precautions Precautions: Fall Precaution Comments: blind, can be combative      Mobility  Bed Mobility Overal bed mobility: Needs Assistance Bed Mobility: Supine to Sit;Sit to Supine     Supine to sit: Total assist Sit to supine: Max assist   General bed mobility comments: attempted to bring legs off bed and lift trunk to engage pt in EOB activity, but combative and hitting so returned to supine, after increased time did pick legs up to put them back in bed  Transfers                    Ambulation/Gait                Stairs            Wheelchair Mobility    Modified Rankin (Stroke Patients Only)       Balance                                             Pertinent Vitals/Pain Faces Pain Scale: Hurts little more Pain Location: would not localize Pain Descriptors / Indicators: Grimacing;Guarding Pain Intervention(s): Repositioned;Limited activity within patient's tolerance    Home  Living Family/patient expects to be discharged to:: Unsure                      Prior Function           Comments: unknown, pt with MR/lethargy no family present     Hand Dominance        Extremity/Trunk Assessment   Upper Extremity Assessment Upper Extremity Assessment: Generalized weakness    Lower Extremity Assessment Lower Extremity Assessment: Generalized weakness       Communication      Cognition Arousal/Alertness: Lethargic Behavior During Therapy: Agitated Overall Cognitive Status: No family/caregiver present to determine baseline cognitive functioning                                        General Comments General comments (skin integrity, edema, etc.): NT stated family here yesterday and did have the pt up in chair    Exercises     Assessment/Plan    PT Assessment Patient needs continued PT services  PT Problem List Decreased strength;Decreased mobility;Decreased activity tolerance  PT Treatment Interventions DME instruction;Balance training;Therapeutic activities;Patient/family education;Functional mobility training    PT Goals (Current goals can be found in the Care Plan section)  Acute Rehab PT Goals Patient Stated Goal: unable to state PT Goal Formulation: Patient unable to participate in goal setting Time For Goal Achievement: 05/15/18 Potential to Achieve Goals: Fair    Frequency Min 3X/week   Barriers to discharge        Co-evaluation               AM-PAC PT "6 Clicks" Mobility  Outcome Measure Help needed turning from your back to your side while in a flat bed without using bedrails?: Total Help needed moving from lying on your back to sitting on the side of a flat bed without using bedrails?: Total Help needed moving to and from a bed to a chair (including a wheelchair)?: Total Help needed standing up from a chair using your arms (e.g., wheelchair or bedside chair)?: Total Help needed to walk  in hospital room?: Total Help needed climbing 3-5 steps with a railing? : Total 6 Click Score: 6    End of Session   Activity Tolerance: Treatment limited secondary to agitation;Patient limited by lethargy Patient left: in bed;with call bell/phone within reach;with bed alarm set   PT Visit Diagnosis: Muscle weakness (generalized) (M62.81);Other abnormalities of gait and mobility (R26.89)    Time: 1202-1221 PT Time Calculation (min) (ACUTE ONLY): 19 min   Charges:   PT Evaluation $PT Eval Moderate Complexity: 1 Mod          Sheran LawlessCyndi , [place] CarolinaPT Acute Rehabilitation Services [phone] 05/08/2018   Elray McgregorCynthia  05/08/2018, 12:35 PM

## 2018-05-08 NOTE — Care Management Important Message (Signed)
Important Message  Patient Details  Name: [name] MRN: [id number] Date of Birth: 1941/02/08   Medicare Important Message Given:  Yes    [name] [name] 05/08/2018, 11:39 AM

## 2018-05-08 NOTE — Progress Notes (Signed)
PROGRESS NOTE                                                                                                                                                                                                             Patient Demographics:    [name], is a 78 y.o. male, DOB - 05/31/1940, [id number]  Admit date - 05/04/2018   Admitting Physician Lahoma Crockerheresa C Sheehan, MD  Outpatient Primary MD for the patient is Knox RoyaltyJones, Enrico, MD  LOS - 4  Chief Complaint  Patient presents with  . Seizures       Brief Narrative  [name] is a 78 y.o. 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  He was up this morning and was given breakfast.  He then went back to his room and was checked on by his family member and he was noted to be moving what look like a seizure-like activity.  Patient does have a seizure disorder and takes Keppra but he has not had a seizure [place].  He felt hot to the touch and concern was raised for a fever.    In the ER he was diagnosed with a UTI/pyelonephritis due to bladder outlet obstruction, he received a Foley catheter urology was consulted and he was admitted to the hospital.   Subjective:   Patient in bed, appears comfortable, mild cough.    Assessment  & Plan :     1.  Sepsis due to UTI/pyelonephritis caused by bladder outlet obstruction.  He is now appearing to be bacteremic with both E. coli and Streptococcus, on 2 g Rocephin, does have a Foley and Flomax, sepsis pathophysiology resolved, improving procalcitonin levels.  Clinically appears nontoxic.  Seen by urology Dr.Herrick who wants outpatient follow-up in 2 weeks.  Be discharged with Foley and Flomax.  2.  ARF.  Again due to bladder outlet obstruction. Foley to decompress, hydrate and monitor.  Avoid nephrotoxins.  Stopped Toradol  and ibuprofen.  Renal function is back to normal.  3. Thrombocytopenia.  Acute on chronic, likely present drop due to combination of sepsis and hemodilution, on prophylactic dose heparin.  Continue to monitor cautiously.    Lab Results  Component Value Date   PLT 62 (L) 05/08/2018   Results for [name], [name] (MRN [id number]) as of 05/08/2018 10:34  Ref.  Range 06/11/2010 08:50 06/12/2010 05:50 06/13/2010 05:50 05/04/2018 08:15 05/05/2018 04:56 05/06/2018 04:34 05/07/2018 04:05 05/08/2018 03:31  Platelets Latest Ref Range: 150 - 400 K/uL 144 (L) 139 (L) 135 (L) 95 (L) 67 (L) 74 (L) 67 (L) 62 (L)    4.  Severe underlying mental retardation.  Supportive care.  5.  H/O seizures.  On IV Keppra.  6.  Cough and hypoxia developing 05/06/2018.  Likely multifocal pneumonia versus aspiration pneumonia -speech therapy following, on dysphagia 1 diet, continue feeding assistance aspiration precautions, on Rocephin and Flagyl combination which will be continued, improving leukocytosis, no fever or significant hypoxia, clinically improving despite chest x-ray is lagging behind.    Family Communication  :  daughter  Code Status :  DNR  Disposition Plan  :  TBD  Consults  :  Urology  Procedures  :    CT head and CXR - non acute  CT ABD- 1. Bilateral hydronephrosis with distention of the bladder and dilatation of the penile urethra. I suspect there is either a distal penile mass or distal urethral stenosis. 2.  Aortic Atherosclerosis (ICD10-I70.0). 3. Irregular nodularity and enlargement of the median lobe of the prostate gland. 4. Nonspecific hepatic periportal edema.   DVT Prophylaxis  :    Heparin    Lab Results  Component Value Date   PLT 62 (L) 05/08/2018    Diet :  Diet Order            DIET - DYS 1 Room service appropriate? Yes; Fluid consistency: Thin  Diet effective now               Inpatient Medications Scheduled Meds: . dorzolamide  1 drop Both Eyes Daily  . feeding supplement (ENSURE  ENLIVE)  237 mL Oral TID BM  . heparin  5,000 Units Subcutaneous Q8H  . latanoprost  1 drop Both Eyes QHS  . tamsulosin  0.4 mg Oral QPC supper   Continuous Infusions: . cefTRIAXone (ROCEPHIN)  IV 2 g (05/07/18 0858)  . levETIRAcetam 250 mg (05/08/18 0953)  . metronidazole 500 mg (05/08/18 0220)   PRN Meds:.acetaminophen **OR** [DISCONTINUED] acetaminophen, ipratropium-albuterol, polyethylene glycol  Antibiotics  :   Anti-infectives (From admission, onward)   Start     Dose/Rate Route Frequency Ordered Stop   05/07/18 1000  cefTRIAXone (ROCEPHIN) 2 g in sodium chloride 0.9 % 100 mL IVPB     2 g 200 mL/hr over 30 Minutes Intravenous Every 24 hours 05/06/18 1010     05/06/18 1100  Ampicillin-Sulbactam (UNASYN) 3 g in sodium chloride 0.9 % 100 mL IVPB  Status:  Discontinued     3 g 200 mL/hr over 30 Minutes Intravenous Every 12 hours 05/06/18 0959 05/06/18 1010   05/06/18 1100  metroNIDAZOLE (FLAGYL) IVPB 500 mg     500 mg 100 mL/hr over 60 Minutes Intravenous Every 8 hours 05/06/18 1010     05/06/18 1000  vancomycin (VANCOCIN) 500 mg in sodium chloride 0.9 % 100 mL IVPB  Status:  Discontinued     500 mg 100 mL/hr over 60 Minutes Intravenous Every 48 hours 05/04/18 0940 05/04/18 1521   05/06/18 1000  cefTRIAXone (ROCEPHIN) 1 g in sodium chloride 0.9 % 100 mL IVPB  Status:  Discontinued     1 g 200 mL/hr over 30 Minutes Intravenous Every 24 hours 05/06/18 0952 05/06/18 1010   05/05/18 1000  ceFEPIme (MAXIPIME) 1 g in sodium chloride 0.9 % 100 mL IVPB  Status:  Discontinued  1 g 200 mL/hr over 30 Minutes Intravenous Every 24 hours 05/04/18 0940 05/04/18 1521   05/05/18 0900  cefTRIAXone (ROCEPHIN) 1 g in sodium chloride 0.9 % 100 mL IVPB  Status:  Discontinued     1 g 200 mL/hr over 30 Minutes Intravenous Every 24 hours 05/04/18 1520 05/05/18 0316   05/05/18 0900  cefTRIAXone (ROCEPHIN) 2 g in sodium chloride 0.9 % 100 mL IVPB  Status:  Discontinued     2 g 200 mL/hr over 30  Minutes Intravenous Every 24 hours 05/05/18 0316 05/06/18 0949   05/04/18 0915  ceFEPIme (MAXIPIME) 2 g in sodium chloride 0.9 % 100 mL IVPB     2 g 200 mL/hr over 30 Minutes Intravenous  Once 05/04/18 0909 05/04/18 1008   05/04/18 0915  metroNIDAZOLE (FLAGYL) IVPB 500 mg  Status:  Discontinued     500 mg 100 mL/hr over 60 Minutes Intravenous Every 8 hours 05/04/18 0909 05/04/18 1521   05/04/18 0915  vancomycin (VANCOCIN) IVPB 1000 mg/200 mL premix     1,000 mg 200 mL/hr over 60 Minutes Intravenous  Once 05/04/18 0909 05/04/18 1059        Objective:   Vitals:   05/07/18 1511 05/07/18 2121 05/08/18 0500 05/08/18 0554  BP: (!) 150/109 (!) 144/102  (!) 139/110  Pulse: 93 92  91  Resp: 18 17  17   Temp: 98.7 F (37.1 C) 99.3 F (37.4 C)  99.1 F (37.3 C)  TempSrc: Oral Oral  Oral  SpO2: 98% 99%  98%  Weight:   55.7 kg   Height:        Wt Readings from Last 3 Encounters:  05/08/18 55.7 kg     Intake/Output Summary (Last 24 hours) at 05/08/2018 1035 Last data filed at 05/08/2018 0556 Gross per 24 hour  Intake 309.69 ml  Output 1150 ml  Net -840.31 ml     Physical Exam  Awake - baseline agitated (MR), Foley in place  [place].AT,PERRAL Supple Neck,No JVD, No cervical lymphadenopathy appriciated.  Symmetrical Chest wall movement, Good air movement bilaterally, CTAB RRR,No Gallops, Rubs or new Murmurs, No Parasternal Heave +ve B.Sounds, Abd Soft, No tenderness, No organomegaly appriciated, No rebound - guarding or rigidity. No Cyanosis, Clubbing or edema, No new Rash or bruise       Data Review:    CBC Recent Labs  Lab 05/04/18 0815 05/05/18 0456 05/06/18 0434 05/07/18 0405 05/08/18 0331  WBC 1.7* 21.2* 25.4* 17.0* 11.3*  HGB 14.3 11.6* 14.3 12.1* 11.1*  HCT 44.6 35.1* 41.7 36.6* 33.7*  PLT 95* 67* 74* 67* 62*  MCV 100.0 98.3 95.4 96.1 97.1  MCH 32.1 32.5 32.7 31.8 32.0  MCHC 32.1 33.0 34.3 33.1 32.9  RDW 12.9 13.3 13.5 13.5 13.4  LYMPHSABS 0.3*  --   --   --    --   MONOABS 0.0*  --   --   --   --   EOSABS 0.0  --   --   --   --   BASOSABS 0.0  --   --   --   --     Chemistries  Recent Labs  Lab 05/04/18 0815 05/05/18 0456 05/06/18 0434 05/07/18 0405 05/08/18 0331  NA 144 146* 147* 146* 143  K 3.4* 3.5 3.5 3.2* 3.7  CL 105 112* 114* 115* 114*  CO2 20* 21* 19* 23 23  GLUCOSE 95 104* 87 137* 120*  BUN 18 22 23  26* 19  CREATININE 1.97* 1.87* 1.76*  1.57* 1.22  CALCIUM 9.4 8.4* 8.5* 8.1* 8.0*  MG  --   --  1.7  --  1.9  AST 36  --   --   --   --   ALT 16  --   --   --   --   ALKPHOS 88  --   --   --   --   BILITOT 1.9*  --   --   --   --    ------------------------------------------------------------------------------------------------------------------ No results for input(s): CHOL, HDL, LDLCALC, TRIG, CHOLHDL, LDLDIRECT in the last 72 hours.  Lab Results  Component Value Date   HGBA1C 5.8 (H) 05/04/2018   ------------------------------------------------------------------------------------------------------------------ No results for input(s): TSH, T4TOTAL, T3FREE, THYROIDAB in the last 72 hours.  Invalid input(s): FREET3 ------------------------------------------------------------------------------------------------------------------ No results for input(s): VITAMINB12, FOLATE, FERRITIN, TIBC, IRON, RETICCTPCT in the last 72 hours.  Coagulation profile Recent Labs  Lab 05/05/18 0456  INR 1.56    No results for input(s): DDIMER in the last 72 hours.  Cardiac Enzymes No results for input(s): CKMB, TROPONINI, MYOGLOBIN in the last 168 hours.  Invalid input(s): CK ------------------------------------------------------------------------------------------------------------------ No results found for: BNP  Micro Results Recent Results (from the past 240 hour(s))  Culture, blood (routine x 2)     Status: None (Preliminary result)   Collection Time: 05/04/18  8:43 AM  Result Value Ref Range Status   Specimen Description  BLOOD SITE NOT SPECIFIED  Final   Special Requests   Final    BOTTLES DRAWN AEROBIC AND ANAEROBIC Blood Culture adequate volume   Culture   Final    NO GROWTH 4 DAYS Performed at [facility], [address], [place], [place] [address]    Report Status PENDING  Incomplete  Culture, blood (routine x 2)     Status: Abnormal   Collection Time: 05/04/18  8:53 AM  Result Value Ref Range Status   Specimen Description BLOOD RIGHT FOREARM  Final   Special Requests BLOOD Blood Culture adequate volume  Final   Culture  Setup Time   Final    GRAM POSITIVE COCCI IN CHAINS IN BOTH AEROBIC AND ANAEROBIC BOTTLES CRITICAL RESULT CALLED TO, READ BACK BY AND VERIFIED WITH: K.AMEND,PHARMD AT 6045 ON 05/05/18 BY G.MCADOO    Culture (A)  Final    VIRIDANS STREPTOCOCCUS ESCHERICHIA COLI CRITICAL RESULT CALLED TO, READ BACK BY AND VERIFIED WITHColin Rhein PHARMD, AT 4098 05/06/18 BY Renato Shin  Performed at [facility], [address], [place], [place] [address]    Report Status 05/07/2018 FINAL  Final   Organism ID, Bacteria ESCHERICHIA COLI  Final      Susceptibility   Escherichia coli - MIC*    AMPICILLIN >=32 RESISTANT Resistant     CEFAZOLIN <=4 SENSITIVE Sensitive     CEFEPIME <=1 SENSITIVE Sensitive     CEFTAZIDIME <=1 SENSITIVE Sensitive     CEFTRIAXONE <=1 SENSITIVE Sensitive     CIPROFLOXACIN >=4 RESISTANT Resistant     GENTAMICIN <=1 SENSITIVE Sensitive     IMIPENEM <=0.25 SENSITIVE Sensitive     TRIMETH/SULFA <=20 SENSITIVE Sensitive     AMPICILLIN/SULBACTAM 16 INTERMEDIATE Intermediate     PIP/TAZO <=4 SENSITIVE Sensitive     Extended ESBL NEGATIVE Sensitive     * ESCHERICHIA COLI  Blood Culture ID Panel (Reflexed)     Status: Abnormal   Collection Time: 05/04/18  8:53 AM  Result Value Ref Range Status   Enterococcus species NOT DETECTED NOT DETECTED  Final   Listeria monocytogenes NOT DETECTED NOT DETECTED Final   Staphylococcus species NOT DETECTED NOT DETECTED Final     Staphylococcus aureus (BCID) NOT DETECTED NOT DETECTED Final   Streptococcus species DETECTED (A) NOT DETECTED Final    Comment: Not Enterococcus species, Streptococcus agalactiae, Streptococcus pyogenes, or Streptococcus pneumoniae. CRITICAL RESULT CALLED TO, READ BACK BY AND VERIFIED WITH: K.AMEND,PHARMD AT 0307 ON 05/05/18 BY G.MCADOO    Streptococcus agalactiae NOT DETECTED NOT DETECTED Final   Streptococcus pneumoniae NOT DETECTED NOT DETECTED Final   Streptococcus pyogenes NOT DETECTED NOT DETECTED Final   Acinetobacter baumannii NOT DETECTED NOT DETECTED Final   Enterobacteriaceae species NOT DETECTED NOT DETECTED Final   Enterobacter cloacae complex NOT DETECTED NOT DETECTED Final   Escherichia coli NOT DETECTED NOT DETECTED Final   Klebsiella oxytoca NOT DETECTED NOT DETECTED Final   Klebsiella pneumoniae NOT DETECTED NOT DETECTED Final   Proteus species NOT DETECTED NOT DETECTED Final   Serratia marcescens NOT DETECTED NOT DETECTED Final   Haemophilus influenzae NOT DETECTED NOT DETECTED Final   Neisseria meningitidis NOT DETECTED NOT DETECTED Final   Pseudomonas aeruginosa NOT DETECTED NOT DETECTED Final   Candida albicans NOT DETECTED NOT DETECTED Final   Candida glabrata NOT DETECTED NOT DETECTED Final   Candida krusei NOT DETECTED NOT DETECTED Final   Candida parapsilosis NOT DETECTED NOT DETECTED Final   Candida tropicalis NOT DETECTED NOT DETECTED Final    Comment: Performed at [facility], [address] [place], [place] [address]  Urine culture     Status: Abnormal   Collection Time: 05/04/18  1:20 PM  Result Value Ref Range Status   Specimen Description URINE, RANDOM  Final   Special Requests NONE  Final   Culture (A)  Final    <10,000 COLONIES/mL INSIGNIFICANT GROWTH Performed at [facility], [address], [place], [place] [address]    Report Status 05/05/2018 FINAL  Final    Radiology Reports Ct Head Wo Contrast  Result Date:  05/04/2018 CLINICAL DATA:  Seizure with nausea and vomiting. Chronic altered mental status EXAM: CT HEAD WITHOUT CONTRAST TECHNIQUE: Contiguous axial images were obtained from the base of the skull through the vertex without intravenous contrast. COMPARISON:  June 10, 2010 FINDINGS: Brain: There is stable mild diffuse atrophy. There is relative enlargement of the atrium of the right lateral ventricle compared to its counterpart on the left, felt to be secondary to ex vacuo phenomenon. There is no appreciable intracranial mass, hemorrhage, extra-axial fluid collection, or midline shift. 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 There is evidence of a prior infarct in the head of the caudate nucleus on the right. There is evidence of a prior infarct involving a portion of the body of the caudate nucleus on the left. There is evidence of a prior infarct in the left frontal lobe adjacent to the anterior horn of the left lateral ventricle. No acute appearing infarct is appreciable. Vascular: There is no appreciable hyperdense vessel. There is calcification in each carotid siphon region. Skull: The bony calvarium appears intact. Sinuses/Orbits: There is mucosal thickening in several ethmoid air cells. Other visualized paranasal sinuses are clear. Orbits appear symmetric bilaterally except for evident cataract removal on the right. Other: Mastoid air cells are clear. IMPRESSION: Atrophy with stable prior infarcts. No acute infarct evident. No mass or hemorrhage. Foci of arterial vascular calcification noted. There is mucosal thickening in several ethmoid air cells. Electronically Signed  By: Bretta Bang III M.D.   On: 05/04/2018 12:28   Ct Abdomen Pelvis W Contrast  Result Date: 05/04/2018 CLINICAL DATA:  Nausea and vomiting. EXAM: CT ABDOMEN AND PELVIS WITH CONTRAST TECHNIQUE: Multidetector CT imaging of the abdomen and pelvis was performed using the standard protocol  following bolus administration of intravenous contrast. CONTRAST:  75mL OMNIPAQUE IOHEXOL 300 MG/ML  SOLN COMPARISON:  None. FINDINGS: Lower chest: No acute abnormality. Hepatobiliary: There are 2 small cysts in the dome of the right lobe of the liver. Slight periportal edema, nonspecific. No focal liver lesions. Biliary tree is normal. Pancreas: Normal. Spleen: Normal. Adrenals/Urinary Tract: There is bilateral moderate hydronephrosis. The ureters are dilated to the level of the distended bladder. Bladder wall is slightly thickened. There is slight irregular and nodularity of the median lobe of the prostate gland. The patient has dilatation of the penile urethra. No visible catheter in place. Possibility of a urethral stenosis or a distal penile mass should be considered. Stomach/Bowel: No dilated loops of large or small bowel. Detail is suboptimal due to lack of contrast and lack of body fat. The appendix is visible and appears normal. Vascular/Lymphatic: Slight aortic atherosclerosis. No discrete adenopathy. Reproductive: Enlargement and irregularity and nodularity of the median lobe of the prostate gland. Seminal vesicles are normal. Other: No free air or free fluid in the abdomen. No discrete hernias. Musculoskeletal: No acute bone abnormality. IMPRESSION: 1. Bilateral hydronephrosis with distention of the bladder and dilatation of the penile urethra. I suspect there is either a distal penile mass or distal urethral stenosis. 2.  Aortic Atherosclerosis (ICD10-I70.0). 3. Irregular nodularity and enlargement of the median lobe of the prostate gland. 4. Nonspecific hepatic periportal edema. Electronically Signed   By: Francene Boyers M.D.   On: 05/04/2018 12:54   Dg Chest Port 1 View  Result Date: 05/07/2018 CLINICAL DATA:  Pneumonia EXAM: PORTABLE CHEST 1 VIEW COMPARISON:  05/05/2018, 05/04/2018 FINDINGS: Slight worsening consolidation at the left base. Suspected small pleural effusions. Bilateral ground-glass  infiltrates are otherwise grossly similar. Stable cardiomediastinal silhouette. No pneumothorax. IMPRESSION: 1. Interval worsening of left lung base atelectasis or pneumonia. Bilateral pulmonary ground-glass infiltrates are otherwise not significantly changed 2. Probable small pleural effusions Electronically Signed   By: Jasmine Pang M.D.   On: 05/07/2018 19:51   Dg Chest Port 1 View  Result Date: 05/05/2018 CLINICAL DATA:  Shortness of breath EXAM: PORTABLE CHEST 1 VIEW COMPARISON:  Portable exam 2347 hours compared to 05/04/2018 FINDINGS: Upper normal size of cardiac silhouette. Mediastinal contours and pulmonary vascularity normal. BILATERAL pulmonary infiltrates favor multifocal pneumonia. No pleural effusion or pneumothorax. Bones demineralized. IMPRESSION: BILATERAL pulmonary infiltrates favoring multifocal pneumonia. Electronically Signed   By: Ulyses Southward M.D.   On: 05/05/2018 23:58   Dg Chest Port 1 View  Result Date: 05/04/2018 CLINICAL DATA:  Fever and vomiting EXAM: PORTABLE CHEST 1 VIEW COMPARISON:  06/10/2010 FINDINGS: Cardiac shadows within normal limits. Mild aortic calcifications are seen. The lungs are well aerated bilaterally. No focal infiltrate or sizable effusion is seen. No acute bony abnormality is noted. IMPRESSION: No active disease. Electronically Signed   By: Alcide Clever M.D.   On: 05/04/2018 08:23    Time Spent in minutes  30   Susa Raring M.D on 05/08/2018 at 10:35 AM  To page go to [email or web address] - password [facility]

## 2018-05-09 ENCOUNTER — Inpatient Hospital Stay (HOSPITAL_COMMUNITY): Payer: Medicare (Managed Care)

## 2018-05-09 DIAGNOSIS — N368 Other specified disorders of urethra: Secondary | ICD-10-CM

## 2018-05-09 DIAGNOSIS — N390 Urinary tract infection, site not specified: Secondary | ICD-10-CM

## 2018-05-09 DIAGNOSIS — D696 Thrombocytopenia, unspecified: Secondary | ICD-10-CM

## 2018-05-09 DIAGNOSIS — R7881 Bacteremia: Secondary | ICD-10-CM

## 2018-05-09 DIAGNOSIS — A4150 Gram-negative sepsis, unspecified: Secondary | ICD-10-CM

## 2018-05-09 DIAGNOSIS — R6250 Unspecified lack of expected normal physiological development in childhood: Secondary | ICD-10-CM

## 2018-05-09 DIAGNOSIS — N10 Acute tubulo-interstitial nephritis: Secondary | ICD-10-CM

## 2018-05-09 LAB — CULTURE, BLOOD (ROUTINE X 2)
CULTURE: NO GROWTH
Special Requests: ADEQUATE

## 2018-05-09 LAB — CBC
HCT: 35.9 % — ABNORMAL LOW (ref 39.0–52.0)
Hemoglobin: 12 g/dL — ABNORMAL LOW (ref 13.0–17.0)
MCH: 32.5 pg (ref 26.0–34.0)
MCHC: 33.4 g/dL (ref 30.0–36.0)
MCV: 97.3 fL (ref 80.0–100.0)
Platelets: 68 10*3/uL — ABNORMAL LOW (ref 150–400)
RBC: 3.69 MIL/uL — ABNORMAL LOW (ref 4.22–5.81)
RDW: 13.2 % (ref 11.5–15.5)
WBC: 6.5 10*3/uL (ref 4.0–10.5)
nRBC: 0 % (ref 0.0–0.2)

## 2018-05-09 LAB — BASIC METABOLIC PANEL
Anion gap: 10 (ref 5–15)
BUN: 17 mg/dL (ref 8–23)
CALCIUM: 8.2 mg/dL — AB (ref 8.9–10.3)
CO2: 22 mmol/L (ref 22–32)
Chloride: 109 mmol/L (ref 98–111)
Creatinine, Ser: 1.25 mg/dL — ABNORMAL HIGH (ref 0.61–1.24)
GFR calc Af Amer: >60 mL/min (ref >60)
GFR calc non Af Amer: 55 mL/min — ABNORMAL LOW (ref >60)
Glucose, Bld: 100 mg/dL — ABNORMAL HIGH (ref 70–99)
Potassium: 3.9 mmol/L (ref 3.5–5.1)
Sodium: 141 mmol/L (ref 135–145)

## 2018-05-09 LAB — GLUCOSE, CAPILLARY: Glucose-Capillary: 95 mg/dL (ref 70–99)

## 2018-05-09 MED ORDER — POLYETHYLENE GLYCOL 3350 17 G PO PACK: 17.0000 g | PACK | Freq: Every day | ORAL | 0 refills | Status: AC | PRN

## 2018-05-09 MED ORDER — TAMSULOSIN HCL 0.4 MG PO CAPS: 0.4000 mg | ORAL_CAPSULE | Freq: Every day | ORAL | 0 refills | Status: AC

## 2018-05-09 MED ORDER — CEFUROXIME AXETIL 500 MG PO TABS: 500.0000 mg | ORAL_TABLET | Freq: Two times a day (BID) | ORAL | 0 refills | Status: AC

## 2018-05-09 NOTE — Progress Notes (Addendum)
CSW spoke with patient's brother at bedside. He reports that patient will be returning home at discharge and not to a SNF. He requests transportation. CSW updated [facility] CSW, Milda Smart [phone]. RNCM aware.   CSW signing off as no further needs present.  Cory Casco Brookie Wayment LCSW [phone]

## 2018-05-09 NOTE — Progress Notes (Signed)
Pt going home with foley cath and following up with urologist for removal. Pt's brother present at this time and educated of emptying foley cath, pt to have RN visits at home as well. Brother has had to care for catheters before he states and states he is comfortable emptying catheter bag as demonstrated.

## 2018-05-09 NOTE — Progress Notes (Signed)
  Speech Language Pathology Treatment: Dysphagia  Patient Details Name: [name] MRN: [id number] DOB: 05/22/1940 Today's Date: 05/09/2018 Time: 0910-0920 SLP Time Calculation (min) (ACUTE ONLY): 10 min  Assessment / Plan / Recommendation Clinical Impression  Pt was seen for skilled dysphagia treatment session this morning. Pt accepted small sips of thin from a straw with no overt s/s aspiration. Although slightly reduced labial seal and atypical tongue thrust pattern observed, pt's swallow function appeared swift and timely given palpation. Pt refused puree trials. Pt's brother at bedside reported pt is with decreased appetite since hospitalization, however typically consumes regular solids at baseline and believes current puree diet may be influencing pt's limited PO intake. Pt is with good prognosis for upgrade once he can be encouraged to trial solids. Recommend continue dysphagia 1 (puree), thin liquids and ST will continue to follow to provide treatment in the acute setting.    HPI HPI: Pt is a 78 y.o. male with medical history significant of ID with developmental delay. He was up this morning and was given breakfast but was subsequently noted to be having what look like a seizure-like activity. Patient has a seizure disorder and takes Keppra but he has not had a seizure since he moved to [place] 3 years ago. He felt hot to the touch and concern was raised for a fever. EMS was activated and transported the patient into the emergency department. Patient had two  episodes of vomiting in the ED. Chest x-ray revealed bilateral pulmonary infiltrates favoring multifocal pneumonia. 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      SLP Plan  Continue with current plan of care       Recommendations  Diet recommendations: Dysphagia 1 (puree);Thin liquid Liquids provided via: Straw Medication Administration: Crushed with  puree Supervision: Full supervision/cueing for compensatory strategies;Staff to assist with self feeding Compensations: Minimize environmental distractions;Slow rate;Small sips/bites Postural Changes and/or Swallow Maneuvers: Seated upright 90 degrees;Upright 30-60 min after meal                Oral Care Recommendations: Oral care BID;Staff/trained caregiver to provide oral care Follow up Recommendations: 24 hour supervision/assistance SLP Visit Diagnosis: Dysphagia, oral phase (R13.11) Plan: Continue with current plan of care       Suzzette Righter, Student SLP                 Suzzette Righter 05/09/2018, 9:27 AM

## 2018-05-09 NOTE — Progress Notes (Signed)
Reviewed instructions with pt's brother/caregiver from 1600-1620.    Copy of instructions and scripts given to pt's brother. Handouts on foley care and new medications reviewed and given to pt/brother.  Pt's brother became upset and started cursing at this nurse, stating "he's not going to be able to go to [facility], he's not going to be able to just pick up that bag and go to the bathroom, he doesn't understand all that because of the way he is, yall don't know him like I do, I've known him for 78 years."  Expressed to pt's brother that the doctor had the social worker to come to see him and offer SNF/rehab so that the pt could get more therapy and they would be able to care for him more at this time.  Pt's brother became more upset and cursing at this RN.  Brother choosing to take pt home, and continue [facility]. Brother expressed that [facility] told him they did not have a therapist at this time to come out to home but are working on it.  Tried to discuss and assist with plan, but brother continued to curse and telling nurse to get out of the room and stated we were doing this because he was black.  Again attempted to explain to pt's brother care that was offered, but he did not want the care, he chose to take him home.   Nurse left the room as pt's brother was esculating his voice and demeanor towards her.  NT Chip Boer was outside of pt's room and heard all that pt's brother was stating.  Brother still wanting to take pt home.  Waiting for [facility] transport to pick pt up.

## 2018-05-09 NOTE — Discharge Summary (Signed)
Physician Discharge Summary  [name] PPI:[id number] DOB: 10-08-1940 DOA: 05/04/2018  PCP: Knox Royalty, MD  Admit date: 05/04/2018 Discharge date: 05/09/2018  Admitted From: Home Disposition: Home  Recommendations for Outpatient Follow-up:  1. Follow up with PCP in 1week with repeat CBC/BMP 2. Follow-up with urology as an outpatient 3. Follow-up in the ED if symptoms worsen or new appear   Home Health: Home health PT/RN Equipment/Devices: Might need home oxygen  Discharge Condition: Stable CODE STATUS: DNR Diet recommendation: Heart Healthy /diet as per SLP recommendations  Brief/Interim Summary: 78 year old with history of evaluation with mental retardation with developmental delay presented with seizure and was found to have UTI with pyelonephritis secondary to bladder outlet obstruction.  Foley catheter was placed;urology recommended outpatient follow-up.  He was also found to have bacteremia.  He was treated with IV antibiotics.  PT recommended SNF.  Family wants to take him home.  He will be discharged home with home health PT/RN on oral antibiotics and Foley catheter.  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  Antibiotic plan as below.  Sepsis is resolved.  Hemodynamically stable  E. coli and Streptococcus viridans bacteremia -Probably from UTI -Currently on Rocephin.  Not spiking temperatures.  White count improved.  Will discharge on 10 more days of oral Ceftin.  Acute pyelonephritis with bladder outlet obstruction -Antibiotic plan as above.  Currently has Foley catheter.  Urology  recommends outpatient follow-up.  Continue Foley catheter.  Acute kidney injury -Second to above.  Resolved.  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  Probably from above.  No signs of bleeding.  Outpatient follow-up  History of seizures -Continue Keppra  Severe underlying mental retardation -Continue supportive care. -PT recommended SNF.  Family wants to take the patient home.  Patient will need home health PT and RN  Cough and hypoxia with concern for aspiration -Still requiring supplemental oxygen.  Will try and evaluate the patient off oxygen but if the patient needs oxygen then he will have to be discharged on supplemental oxygen.  Continue Ceftin as above.  Discharge Instructions  Discharge Instructions    Ambulatory referral to Urology   Complete by:  As directed    Call MD for:  difficulty breathing, headache or visual disturbances   Complete by:  As directed    Call MD for:  extreme fatigue   Complete by:  As directed    Call MD for:  hives   Complete by:  As directed    Call MD for:  persistant dizziness or light-headedness   Complete by:  As directed    Call MD for:  persistant nausea and vomiting   Complete by:  As directed    Call MD for:  severe uncontrolled pain   Complete by:  As directed    Call MD for:  temperature >100.4   Complete by:  As directed    Diet - low sodium heart healthy   Complete by:  As directed    Increase activity slowly   Complete by:  As directed      Allergies as of 05/09/2018   No Known Allergies     Medication List  TAKE these medications   bimatoprost 0.01 % Soln Commonly known as:  LUMIGAN Place 1 drop into both eyes at bedtime.   cefUROXime 500 MG tablet Commonly known as:  CEFTIN Take 1 tablet (500 mg total) by mouth 2 (two) times daily for 10 days.   dorzolamide 2 % ophthalmic solution Commonly known as:  TRUSOPT Place 1 drop into both eyes daily.   ENSURE PLUS Liqd Take 237 mLs by mouth 3 (three) times daily  between meals.   levETIRAcetam 250 MG tablet Commonly known as:  KEPPRA Take 250 mg by mouth 2 (two) times daily.   polyethylene glycol packet Commonly known as:  MIRALAX / GLYCOLAX Take 17 g by mouth daily as needed for mild constipation.   tamsulosin 0.4 MG Caps capsule Commonly known as:  FLOMAX Take 1 capsule (0.4 mg total) by mouth daily after supper.      Follow-up Information    [facility] In 2 weeks.   Why:  for catheter removal Contact information: [address] [place] [address] [phone]       Knox Royalty, MD. Schedule an appointment as soon as possible for a visit in 1 week(s).   Specialty:  Family Medicine Why:  with repeat CBC/BMP Contact information: [address] [place] [address] [phone]          No Known Allergies  Consultations:  Urology   Procedures/Studies: Ct Head Wo Contrast  Result Date: 05/04/2018 CLINICAL DATA:  Seizure with nausea and vomiting. Chronic altered mental status EXAM: CT HEAD WITHOUT CONTRAST TECHNIQUE: Contiguous axial images were obtained from the base of the skull through the vertex without intravenous contrast. COMPARISON:  June 10, 2010 FINDINGS: Brain: There is stable mild diffuse atrophy. There is relative enlargement of the atrium of the right lateral ventricle compared to its counterpart on the left, felt to be secondary to ex vacuo phenomenon. There is no appreciable intracranial mass, hemorrhage, extra-axial fluid collection, or midline shift. 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 There is evidence of a prior infarct in the head of the caudate nucleus on the right. There is evidence of a prior infarct involving a portion of the body of the caudate nucleus on the left. There is evidence of a prior infarct in the left frontal lobe adjacent to the anterior horn of the left lateral ventricle. No acute appearing infarct is  appreciable. Vascular: There is no appreciable hyperdense vessel. There is calcification in each carotid siphon region. Skull: The bony calvarium appears intact. Sinuses/Orbits: There is mucosal thickening in several ethmoid air cells. Other visualized paranasal sinuses are clear. Orbits appear symmetric bilaterally except for evident cataract removal on the right. Other: Mastoid air cells are clear. IMPRESSION: Atrophy with stable prior infarcts. No acute infarct evident. No mass or hemorrhage. Foci of arterial vascular calcification noted. There is mucosal thickening in several ethmoid air cells. Electronically Signed   By: Bretta Bang III M.D.   On: 05/04/2018 12:28   Ct Abdomen Pelvis W Contrast  Result Date: 05/04/2018 CLINICAL DATA:  Nausea and vomiting. EXAM: CT ABDOMEN AND PELVIS WITH CONTRAST TECHNIQUE: Multidetector CT imaging of the abdomen and pelvis was performed using the standard protocol following bolus administration of intravenous contrast. CONTRAST:  61mL OMNIPAQUE IOHEXOL 300 MG/ML  SOLN COMPARISON:  None. FINDINGS: Lower chest: No acute abnormality. Hepatobiliary: There are 2 small cysts in the dome of the right lobe of the liver.  Slight periportal edema, nonspecific. No focal liver lesions. Biliary tree is normal. Pancreas: Normal. Spleen: Normal. Adrenals/Urinary Tract: There is bilateral moderate hydronephrosis. The ureters are dilated to the level of the distended bladder. Bladder wall is slightly thickened. There is slight irregular and nodularity of the median lobe of the prostate gland. The patient has dilatation of the penile urethra. No visible catheter in place. Possibility of a urethral stenosis or a distal penile mass should be considered. Stomach/Bowel: No dilated loops of large or small bowel. Detail is suboptimal due to lack of contrast and lack of body fat. The appendix is visible and appears normal. Vascular/Lymphatic: Slight aortic atherosclerosis. No discrete  adenopathy. Reproductive: Enlargement and irregularity and nodularity of the median lobe of the prostate gland. Seminal vesicles are normal. Other: No free air or free fluid in the abdomen. No discrete hernias. Musculoskeletal: No acute bone abnormality. IMPRESSION: 1. Bilateral hydronephrosis with distention of the bladder and dilatation of the penile urethra. I suspect there is either a distal penile mass or distal urethral stenosis. 2.  Aortic Atherosclerosis (ICD10-I70.0). 3. Irregular nodularity and enlargement of the median lobe of the prostate gland. 4. Nonspecific hepatic periportal edema. Electronically Signed   By: Francene BoyersJames  Maxwell M.D.   On: 05/04/2018 12:54   Dg Chest Port 1 View  Result Date: 05/09/2018 CLINICAL DATA:  Leukocytosis EXAM: PORTABLE CHEST 1 VIEW COMPARISON:  Two days ago FINDINGS: Hazy and interstitial opacities on the left more than right with layering pleural fluid. There is dense retrocardiac opacification. No pneumothorax. Borderline heart size IMPRESSION: Unchanged bilateral lung opacity likely reflecting pneumonia in this setting. Electronically Signed   By: Marnee SpringJonathon  Watts M.D.   On: 05/09/2018 09:08   Dg Chest Port 1 View  Result Date: 05/07/2018 CLINICAL DATA:  Pneumonia EXAM: PORTABLE CHEST 1 VIEW COMPARISON:  05/05/2018, 05/04/2018 FINDINGS: Slight worsening consolidation at the left base. Suspected small pleural effusions. Bilateral ground-glass infiltrates are otherwise grossly similar. Stable cardiomediastinal silhouette. No pneumothorax. IMPRESSION: 1. Interval worsening of left lung base atelectasis or pneumonia. Bilateral pulmonary ground-glass infiltrates are otherwise not significantly changed 2. Probable small pleural effusions Electronically Signed   By: Jasmine PangKim  Fujinaga M.D.   On: 05/07/2018 19:51   Dg Chest Port 1 View  Result Date: 05/05/2018 CLINICAL DATA:  Shortness of breath EXAM: PORTABLE CHEST 1 VIEW COMPARISON:  Portable exam 2347 hours compared to  05/04/2018 FINDINGS: Upper normal size of cardiac silhouette. Mediastinal contours and pulmonary vascularity normal. BILATERAL pulmonary infiltrates favor multifocal pneumonia. No pleural effusion or pneumothorax. Bones demineralized. IMPRESSION: BILATERAL pulmonary infiltrates favoring multifocal pneumonia. Electronically Signed   By: Ulyses SouthwardMark  Boles M.D.   On: 05/05/2018 23:58   Dg Chest Port 1 View  Result Date: 05/04/2018 CLINICAL DATA:  Fever and vomiting EXAM: PORTABLE CHEST 1 VIEW COMPARISON:  06/10/2010 FINDINGS: Cardiac shadows within normal limits. Mild aortic calcifications are seen. The lungs are well aerated bilaterally. No focal infiltrate or sizable effusion is seen. No acute bony abnormality is noted. IMPRESSION: No active disease. Electronically Signed   By: Alcide CleverMark  Lukens M.D.   On: 05/04/2018 08:23   Dg Abd Portable 1v  Result Date: 05/09/2018 CLINICAL DATA:  Leukocytosis.  Vomiting EXAM: PORTABLE ABDOMEN - 1 VIEW COMPARISON:  05/04/2018 FINDINGS: Nonobstructive bowel gas pattern. No concerning mass effect or gas collection. Pelvic phleboliths. IMPRESSION: Normal bowel gas pattern. Electronically Signed   By: Marnee SpringJonathon  Watts M.D.   On: 05/09/2018 09:11     Subjective: Patient seen and examined at  bedside.  No overnight fever, nausea or vomiting reported.  Nurse reports that he was slightly combative last night.  Discharge Exam: Vitals:   05/08/18 2144 05/09/18 0529  BP: (!) 133/97 (!) 141/103  Pulse: 77 78  Resp: 18 18  Temp: 97.9 F (36.6 C) 98.1 F (36.7 C)  SpO2: 100% 96%   Vitals:   05/08/18 1456 05/08/18 2144 05/09/18 0529 05/09/18 0700  BP: (!) 149/105 (!) 133/97 (!) 141/103   Pulse: 85 77 78   Resp: 16 18 18    Temp: 98.5 F (36.9 C) 97.9 F (36.6 C) 98.1 F (36.7 C)   TempSrc: Oral Oral Oral   SpO2: 100% 100% 96%   Weight:    56 kg  Height:        General: Pt is sleepy, hardly wakes up on calling his name.  No agitation currently.  Foley catheter in  place. Cardiovascular: rate controlled, S1/S2 + Respiratory: bilateral decreased breath sounds at bases with some scattered crackles Abdominal: Soft, NT, ND, bowel sounds + Extremities: no edema, no cyanosis    The results of significant diagnostics from this hospitalization (including imaging, microbiology, ancillary and laboratory) are listed below for reference.     Microbiology: Recent Results (from the past 240 hour(s))  Culture, blood (routine x 2)     Status: None   Collection Time: 05/04/18  8:43 AM  Result Value Ref Range Status   Specimen Description BLOOD SITE NOT SPECIFIED  Final   Special Requests   Final    BOTTLES DRAWN AEROBIC AND ANAEROBIC Blood Culture adequate volume   Culture   Final    NO GROWTH 5 DAYS Performed at [facility], [address], [place], [place] [address]    Report Status 05/09/2018 FINAL  Final  Culture, blood (routine x 2)     Status: Abnormal   Collection Time: 05/04/18  8:53 AM  Result Value Ref Range Status   Specimen Description BLOOD RIGHT FOREARM  Final   Special Requests BLOOD Blood Culture adequate volume  Final   Culture  Setup Time   Final    GRAM POSITIVE COCCI IN CHAINS IN BOTH AEROBIC AND ANAEROBIC BOTTLES CRITICAL RESULT CALLED TO, READ BACK BY AND VERIFIED WITH: K.AMEND,PHARMD AT 6045 ON 05/05/18 BY G.MCADOO    Culture (A)  Final    VIRIDANS STREPTOCOCCUS ESCHERICHIA COLI CRITICAL RESULT CALLED TO, READ BACK BY AND VERIFIED WITHColin Rhein PHARMD, AT 4098 05/06/18 BY Renato Shin  Performed at [facility], [address], [place], [place] [address]    Report Status 05/07/2018 FINAL  Final   Organism ID, Bacteria ESCHERICHIA COLI  Final      Susceptibility   Escherichia coli - MIC*    AMPICILLIN >=32 RESISTANT Resistant     CEFAZOLIN <=4 SENSITIVE Sensitive     CEFEPIME <=1 SENSITIVE Sensitive     CEFTAZIDIME <=1 SENSITIVE Sensitive     CEFTRIAXONE <=1 SENSITIVE Sensitive     CIPROFLOXACIN >=4 RESISTANT  Resistant     GENTAMICIN <=1 SENSITIVE Sensitive     IMIPENEM <=0.25 SENSITIVE Sensitive     TRIMETH/SULFA <=20 SENSITIVE Sensitive     AMPICILLIN/SULBACTAM 16 INTERMEDIATE Intermediate     PIP/TAZO <=4 SENSITIVE Sensitive     Extended ESBL NEGATIVE Sensitive     * ESCHERICHIA COLI  Blood Culture ID Panel (Reflexed)     Status: Abnormal   Collection Time: 05/04/18  8:53 AM  Result Value Ref Range Status  Enterococcus species NOT DETECTED NOT DETECTED Final   Listeria monocytogenes NOT DETECTED NOT DETECTED Final   Staphylococcus species NOT DETECTED NOT DETECTED Final   Staphylococcus aureus (BCID) NOT DETECTED NOT DETECTED Final   Streptococcus species DETECTED (A) NOT DETECTED Final    Comment: Not Enterococcus species, Streptococcus agalactiae, Streptococcus pyogenes, or Streptococcus pneumoniae. CRITICAL RESULT CALLED TO, READ BACK BY AND VERIFIED WITH: K.AMEND,PHARMD AT 0307 ON 05/05/18 BY G.MCADOO    Streptococcus agalactiae NOT DETECTED NOT DETECTED Final   Streptococcus pneumoniae NOT DETECTED NOT DETECTED Final   Streptococcus pyogenes NOT DETECTED NOT DETECTED Final   Acinetobacter baumannii NOT DETECTED NOT DETECTED Final   Enterobacteriaceae species NOT DETECTED NOT DETECTED Final   Enterobacter cloacae complex NOT DETECTED NOT DETECTED Final   Escherichia coli NOT DETECTED NOT DETECTED Final   Klebsiella oxytoca NOT DETECTED NOT DETECTED Final   Klebsiella pneumoniae NOT DETECTED NOT DETECTED Final   Proteus species NOT DETECTED NOT DETECTED Final   Serratia marcescens NOT DETECTED NOT DETECTED Final   Haemophilus influenzae NOT DETECTED NOT DETECTED Final   Neisseria meningitidis NOT DETECTED NOT DETECTED Final   Pseudomonas aeruginosa NOT DETECTED NOT DETECTED Final   Candida albicans NOT DETECTED NOT DETECTED Final   Candida glabrata NOT DETECTED NOT DETECTED Final   Candida krusei NOT DETECTED NOT DETECTED Final   Candida parapsilosis NOT DETECTED NOT DETECTED  Final   Candida tropicalis NOT DETECTED NOT DETECTED Final    Comment: Performed at [facility], [address] [place], [place] [address]  Urine culture     Status: Abnormal   Collection Time: 05/04/18  1:20 PM  Result Value Ref Range Status   Specimen Description URINE, RANDOM  Final   Special Requests NONE  Final   Culture (A)  Final    <10,000 COLONIES/mL INSIGNIFICANT GROWTH Performed at [facility], [address] [place], [place] [address]    Report Status 05/05/2018 FINAL  Final     Labs: BNP (last 3 results) No results for input(s): BNP in the last 8760 hours. Basic Metabolic Panel: Recent Labs  Lab 05/05/18 0456 05/06/18 0434 05/07/18 0405 05/08/18 0331 05/09/18 0403  NA 146* 147* 146* 143 141  K 3.5 3.5 3.2* 3.7 3.9  CL 112* 114* 115* 114* 109  CO2 21* 19* 23 23 22   GLUCOSE 104* 87 137* 120* 100*  BUN 22 23 26* 19 17  CREATININE 1.87* 1.76* 1.57* 1.22 1.25*  CALCIUM 8.4* 8.5* 8.1* 8.0* 8.2*  MG  --  1.7  --  1.9  --    Liver Function Tests: Recent Labs  Lab 05/04/18 0815  AST 36  ALT 16  ALKPHOS 88  BILITOT 1.9*  PROT 6.7  ALBUMIN 3.5   Recent Labs  Lab 05/04/18 0815  LIPASE 23   No results for input(s): AMMONIA in the last 168 hours. CBC: Recent Labs  Lab 05/04/18 0815 05/05/18 0456 05/06/18 0434 05/07/18 0405 05/08/18 0331 05/09/18 0403  WBC 1.7* 21.2* 25.4* 17.0* 11.3* 6.5  NEUTROABS 1.4*  --   --   --   --   --   HGB 14.3 11.6* 14.3 12.1* 11.1* 12.0*  HCT 44.6 35.1* 41.7 36.6* 33.7* 35.9*  MCV 100.0 98.3 95.4 96.1 97.1 97.3  PLT 95* 67* 74* 67* 62* 68*   Cardiac Enzymes: No results for input(s): CKTOTAL, CKMB, CKMBINDEX, TROPONINI in the last 168 hours. BNP: Invalid input(s): POCBNP CBG: Recent Labs  Lab 05/05/18 0900 05/06/18  0750 05/07/18 0753 05/08/18 0729 05/09/18 0835  GLUCAP 104* 91 112* 117* 95   D-Dimer No results for input(s): DDIMER in the last 72 hours. Hgb A1c No results for input(s):  HGBA1C in the last 72 hours. Lipid Profile No results for input(s): CHOL, HDL, LDLCALC, TRIG, CHOLHDL, LDLDIRECT in the last 72 hours. Thyroid function studies No results for input(s): TSH, T4TOTAL, T3FREE, THYROIDAB in the last 72 hours.  Invalid input(s): FREET3 Anemia work up No results for input(s): VITAMINB12, FOLATE, FERRITIN, TIBC, IRON, RETICCTPCT in the last 72 hours. Urinalysis    Component Value Date/Time   COLORURINE YELLOW 05/04/2018 1249   APPEARANCEUR HAZY (A) 05/04/2018 1249   LABSPEC 1.006 05/04/2018 1249   PHURINE 5.0 05/04/2018 1249   GLUCOSEU NEGATIVE 05/04/2018 1249   HGBUR LARGE (A) 05/04/2018 1249   BILIRUBINUR NEGATIVE 05/04/2018 1249   KETONESUR NEGATIVE 05/04/2018 1249   PROTEINUR NEGATIVE 05/04/2018 1249   UROBILINOGEN 1.0 06/11/2010 0005   NITRITE NEGATIVE 05/04/2018 1249   LEUKOCYTESUR MODERATE (A) 05/04/2018 1249   Sepsis Labs Invalid input(s): PROCALCITONIN,  WBC,  LACTICIDVEN Microbiology Recent Results (from the past 240 hour(s))  Culture, blood (routine x 2)     Status: None   Collection Time: 05/04/18  8:43 AM  Result Value Ref Range Status   Specimen Description BLOOD SITE NOT SPECIFIED  Final   Special Requests   Final    BOTTLES DRAWN AEROBIC AND ANAEROBIC Blood Culture adequate volume   Culture   Final    NO GROWTH 5 DAYS Performed at [facility], [address] [place], [place] [address]    Report Status 05/09/2018 FINAL  Final  Culture, blood (routine x 2)     Status: Abnormal   Collection Time: 05/04/18  8:53 AM  Result Value Ref Range Status   Specimen Description BLOOD RIGHT FOREARM  Final   Special Requests BLOOD Blood Culture adequate volume  Final   Culture  Setup Time   Final    GRAM POSITIVE COCCI IN CHAINS IN BOTH AEROBIC AND ANAEROBIC BOTTLES CRITICAL RESULT CALLED TO, READ BACK BY AND VERIFIED WITH: K.AMEND,PHARMD AT 9811 ON 05/05/18 BY G.MCADOO    Culture (A)  Final    VIRIDANS STREPTOCOCCUS ESCHERICHIA  COLI CRITICAL RESULT CALLED TO, READ BACK BY AND VERIFIED WITHColin Rhein PHARMD, AT 9147 05/06/18 BY Renato Shin  Performed at [facility], [address] [place], [place] [address]    Report Status 05/07/2018 FINAL  Final   Organism ID, Bacteria ESCHERICHIA COLI  Final      Susceptibility   Escherichia coli - MIC*    AMPICILLIN >=32 RESISTANT Resistant     CEFAZOLIN <=4 SENSITIVE Sensitive     CEFEPIME <=1 SENSITIVE Sensitive     CEFTAZIDIME <=1 SENSITIVE Sensitive     CEFTRIAXONE <=1 SENSITIVE Sensitive     CIPROFLOXACIN >=4 RESISTANT Resistant     GENTAMICIN <=1 SENSITIVE Sensitive     IMIPENEM <=0.25 SENSITIVE Sensitive     TRIMETH/SULFA <=20 SENSITIVE Sensitive     AMPICILLIN/SULBACTAM 16 INTERMEDIATE Intermediate     PIP/TAZO <=4 SENSITIVE Sensitive     Extended ESBL NEGATIVE Sensitive     * ESCHERICHIA COLI  Blood Culture ID Panel (Reflexed)     Status: Abnormal   Collection Time: 05/04/18  8:53 AM  Result Value Ref Range Status   Enterococcus species NOT DETECTED NOT DETECTED Final   Listeria monocytogenes NOT DETECTED NOT DETECTED Final   Staphylococcus species  NOT DETECTED NOT DETECTED Final   Staphylococcus aureus (BCID) NOT DETECTED NOT DETECTED Final   Streptococcus species DETECTED (A) NOT DETECTED Final    Comment: Not Enterococcus species, Streptococcus agalactiae, Streptococcus pyogenes, or Streptococcus pneumoniae. CRITICAL RESULT CALLED TO, READ BACK BY AND VERIFIED WITH: K.AMEND,PHARMD AT 0307 ON 05/05/18 BY G.MCADOO    Streptococcus agalactiae NOT DETECTED NOT DETECTED Final   Streptococcus pneumoniae NOT DETECTED NOT DETECTED Final   Streptococcus pyogenes NOT DETECTED NOT DETECTED Final   Acinetobacter baumannii NOT DETECTED NOT DETECTED Final   Enterobacteriaceae species NOT DETECTED NOT DETECTED Final   Enterobacter cloacae complex NOT DETECTED NOT DETECTED Final   Escherichia coli NOT DETECTED NOT DETECTED Final   Klebsiella oxytoca NOT  DETECTED NOT DETECTED Final   Klebsiella pneumoniae NOT DETECTED NOT DETECTED Final   Proteus species NOT DETECTED NOT DETECTED Final   Serratia marcescens NOT DETECTED NOT DETECTED Final   Haemophilus influenzae NOT DETECTED NOT DETECTED Final   Neisseria meningitidis NOT DETECTED NOT DETECTED Final   Pseudomonas aeruginosa NOT DETECTED NOT DETECTED Final   Candida albicans NOT DETECTED NOT DETECTED Final   Candida glabrata NOT DETECTED NOT DETECTED Final   Candida krusei NOT DETECTED NOT DETECTED Final   Candida parapsilosis NOT DETECTED NOT DETECTED Final   Candida tropicalis NOT DETECTED NOT DETECTED Final    Comment: Performed at [facility], [address] [place], [place] [address]  Urine culture     Status: Abnormal   Collection Time: 05/04/18  1:20 PM  Result Value Ref Range Status   Specimen Description URINE, RANDOM  Final   Special Requests NONE  Final   Culture (A)  Final    <10,000 COLONIES/mL INSIGNIFICANT GROWTH Performed at [facility], [address] [place], [place] [address]    Report Status 05/05/2018 FINAL  Final     Time coordinating discharge: 35 minutes  SIGNED:   Glade LloydKshitiz Roan Miklos, MD  [facility] 05/09/2018, 10:33 AM

## 2018-05-09 NOTE — Progress Notes (Signed)
[facility] here to pick pt up, transporter spoke with brother briefly as he left when they arrived to his room.  Belongings sent with pt.

## 2018-05-09 NOTE — Care Management Note (Signed)
Case Management Note  Patient Details  Name: [name] MRN: [id number] Date of Birth: 1940-09-29  Subjective/Objective:          Sepsis 2/2 gram negative UTI.Resides with brother([name]) and sister in law([name]). Affiliated with [facility].           [name] (Other)       [phone]          Action/Plan: Transition to home once hospital bed has been delivered to home today. Delivery time is  between 1pm-5pm.   [facility]'s CSW, [name], states  [facility] is to manage pt's in home care once pt discharges from hospital.  [facility] will need to be called by nurse to arrange pt's transportation to home once equipment has been delivered.  Expected Discharge Date:  05/09/18               Expected Discharge Plan:  Home w Home Health Services  In-House Referral:  Clinical Social Work(Family decline SNF placement, [facility] manages TOC needs. Pt will d/c to home with in home care )  Discharge planning Services  CM Consult  Post Acute Care Choice:  NA Choice offered to:  NA  DME Arranged:  N/A DME Agency:  NA  HH Arranged:  NA HH Agency:  NA  Status of Service:  Completed, signed off  If discussed at Long Length of Stay Meetings, dates discussed:    Additional Comments:  Cory Lesches, RN 05/09/2018, 1:41 PM

## 2018-05-14 LAB — CULTURE, BLOOD (ROUTINE X 2)
Culture: NO GROWTH
Culture: NO GROWTH
Special Requests: ADEQUATE
Special Requests: ADEQUATE

## 2019-06-29 IMAGING — CT CT HEAD W/O CM
3 of 4 series · 14 of 47 positions shown, 16 images · non-contrast
Comparison: June 10, 2010

CLINICAL DATA: Seizure with nausea and vomiting. Chronic altered
mental status

EXAM:
CT HEAD WITHOUT CONTRAST
TECHNIQUE: Contiguous axial images were obtained from the base of the skull
through the vertex without intravenous contrast.

[Series 4: head 2.0 h70h · axial · 0.39mm/px · z∈[-76,+42]mm · 8 of 75 slices shown, 10 images]
[im 8/75  brain]
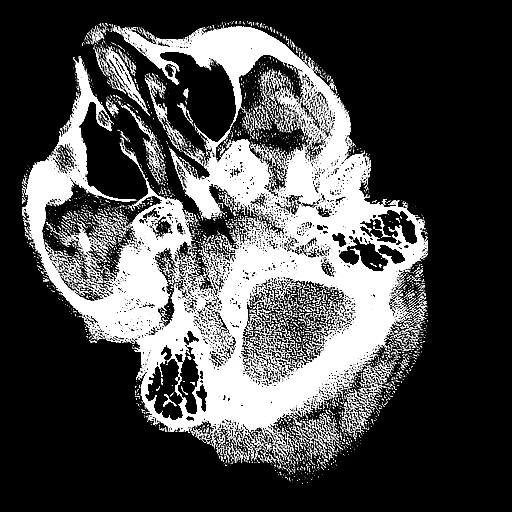
[im 8/75  bone]
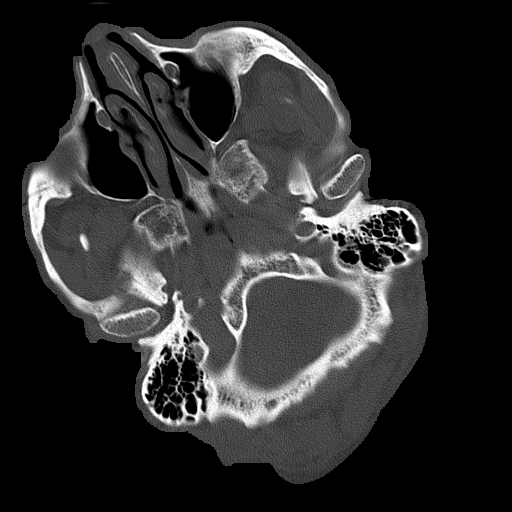
[im 15/75  brain]
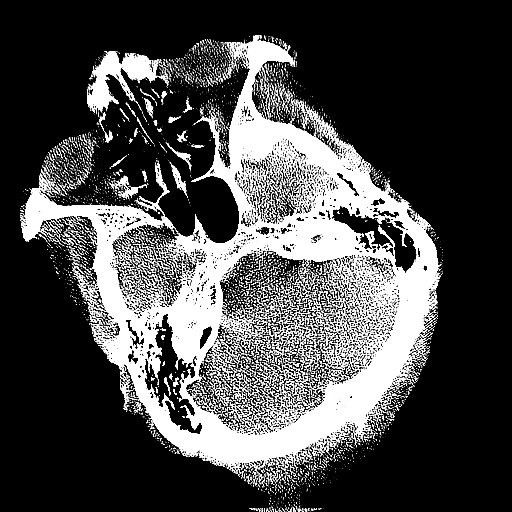
[im 23/75  brain]
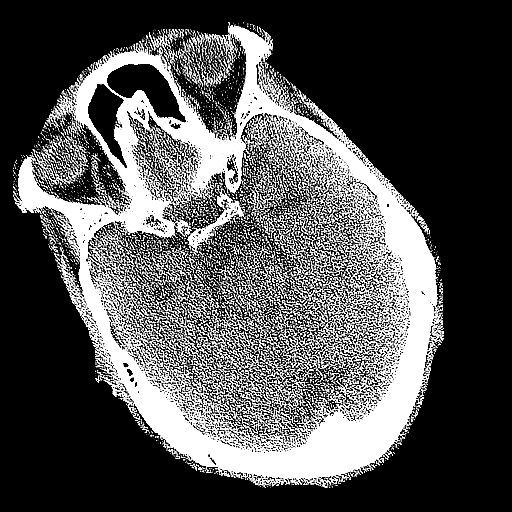
[im 34/75  brain]
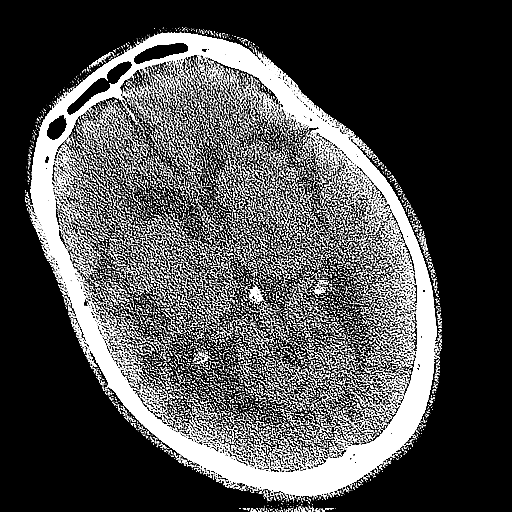
[im 41/75  brain]
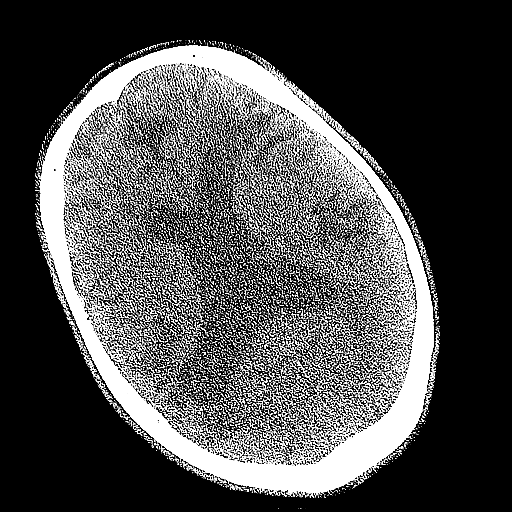
[im 41/75  bone]
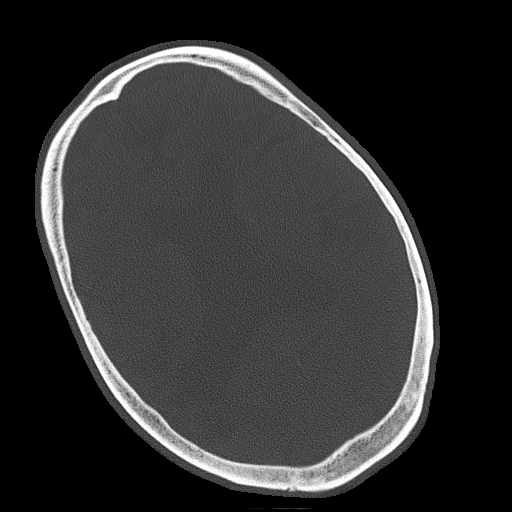
[im 52/75  brain]
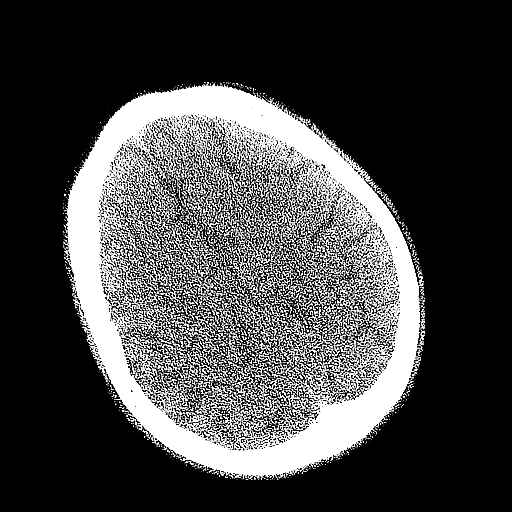
[im 60/75  brain]
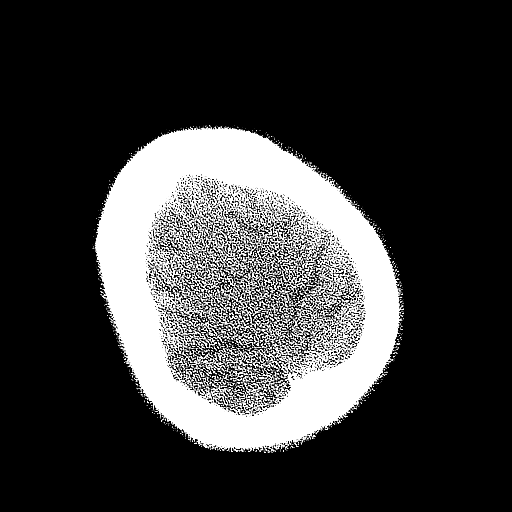
[im 67/75  brain]
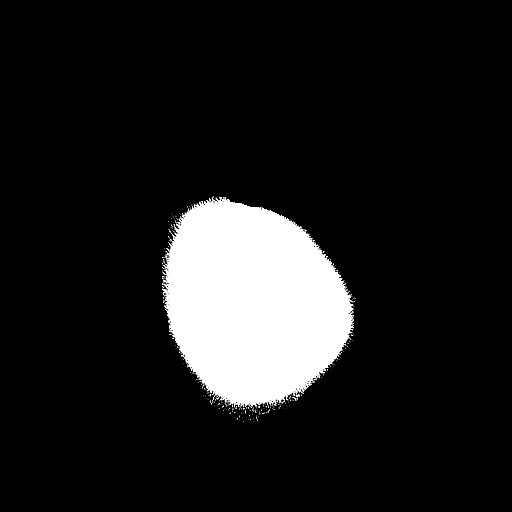

[Series 5: head 3.0 mpr cor · coronal · 0.29mm/px · 3 of 66 slices shown]
[im 22/66  brain]
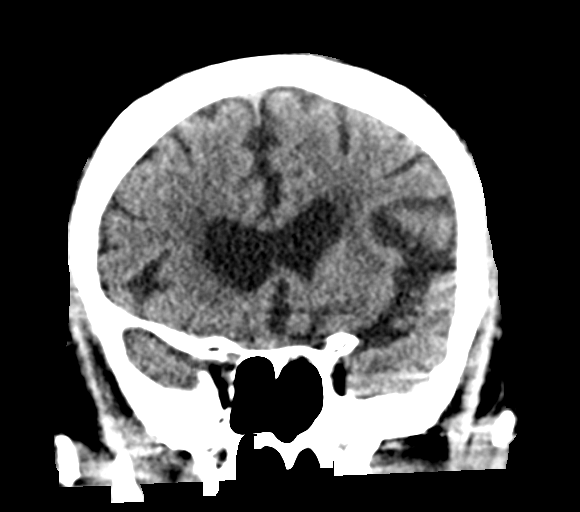
[im 29/66  brain]
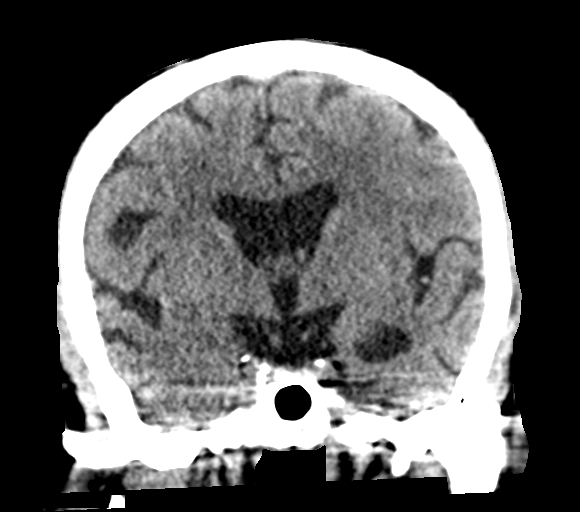
[im 37/66  brain]
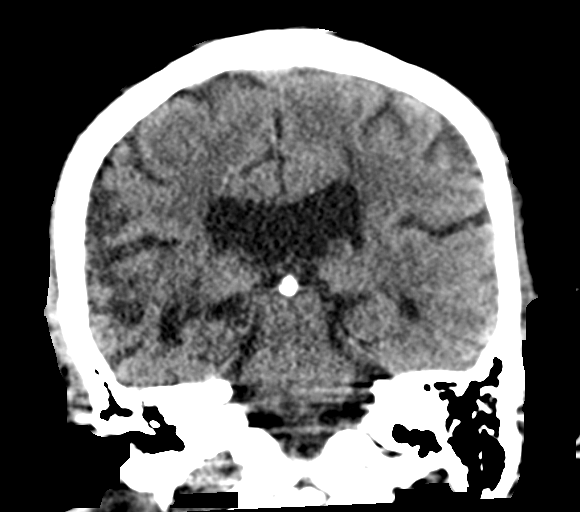

[Series 6: head 3.0 mpr sag · sagittal · 0.29mm/px · 3 of 52 slices shown]
[im 18/52  brain]
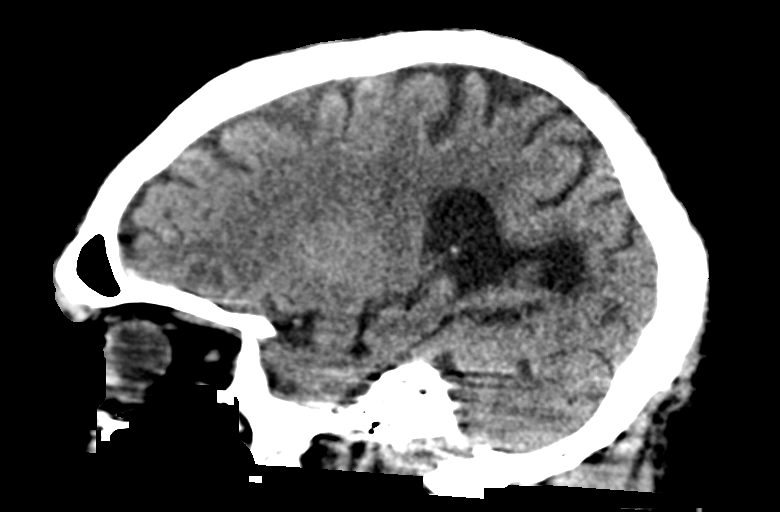
[im 26/52  brain]
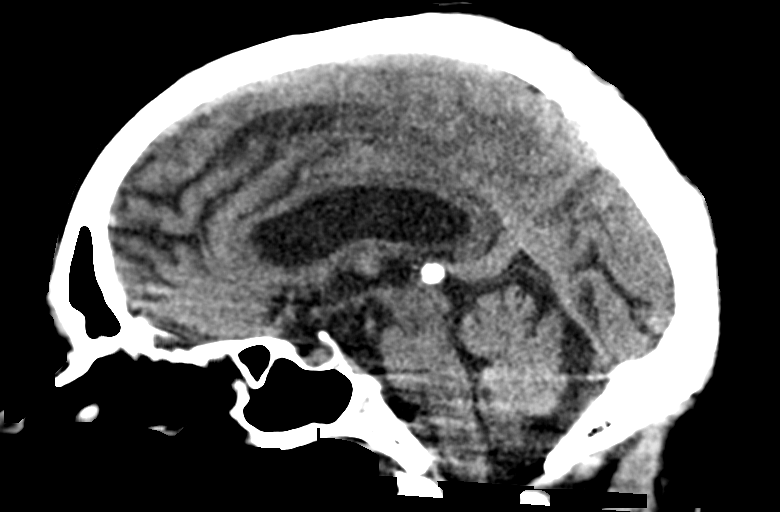
[im 35/52  brain]
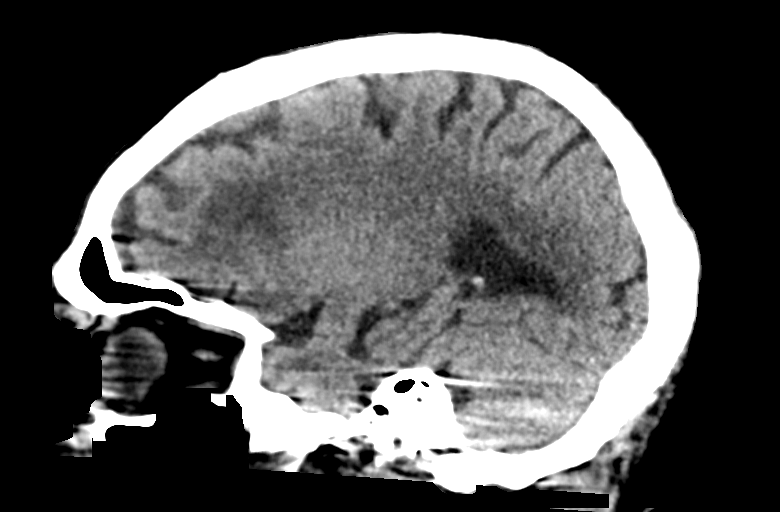

[14 of 47 positions shown; findings below may reference images not displayed]

FINDINGS: Brain: There is stable mild diffuse atrophy. There is relative
enlargement of the atrium of the right lateral ventricle compared to
its counterpart on the left, felt to be secondary to ex vacuo
phenomenon. There is no appreciable intracranial mass, hemorrhage,
extra-axial fluid collection, or midline shift. There is evidence of
prior infarct in the posterior right temporal lobe, stable. There is
evidence of a prior infarct in the head of the caudate nucleus on
the right. There is evidence of a prior infarct involving a portion
of the body of the caudate nucleus on the left. There is evidence of
a prior infarct in the left frontal lobe adjacent to the anterior
horn of the left lateral ventricle. No acute appearing infarct is
appreciable.

Vascular: There is no appreciable hyperdense vessel. There is
calcification in each carotid siphon region.

Skull: The bony calvarium appears intact.

Sinuses/Orbits: There is mucosal thickening in several ethmoid air
cells. Other visualized paranasal sinuses are clear. Orbits appear
symmetric bilaterally except for evident cataract removal on the
right.

Other: Mastoid air cells are clear.
IMPRESSION: Atrophy with stable prior infarcts. No acute infarct evident. No
mass or hemorrhage.

Foci of arterial vascular calcification noted. There is mucosal
thickening in several ethmoid air cells.

## 2019-07-04 IMAGING — DX DG CHEST 1V PORT
1 series · 1 of 1 positions shown · non-contrast
Comparison: Two days ago

CLINICAL DATA: Leukocytosis

EXAM:
PORTABLE CHEST 1 VIEW

[chest ap]
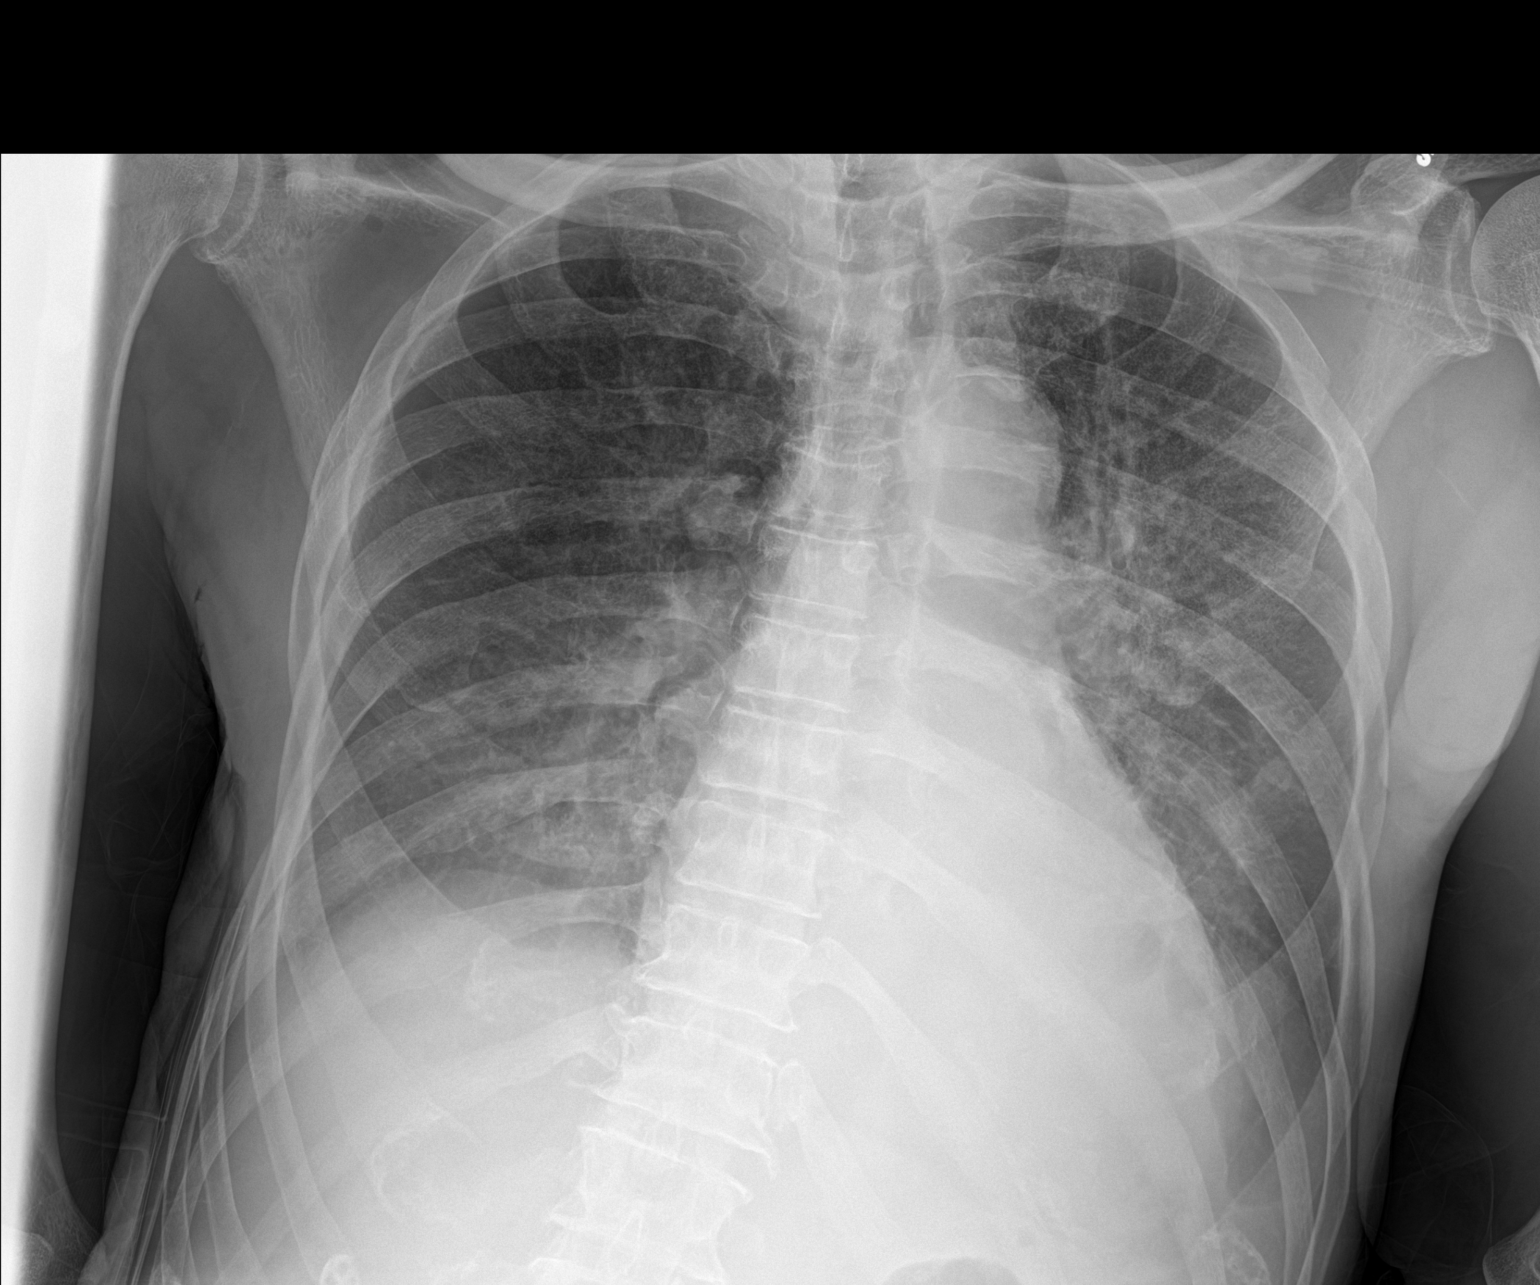

[1 of 1 positions shown; findings below may reference images not displayed]

FINDINGS: Hazy and interstitial opacities on the left more than right with
layering pleural fluid. There is dense retrocardiac opacification.
No pneumothorax. Borderline heart size
IMPRESSION: Unchanged bilateral lung opacity likely reflecting pneumonia in this
setting.
